# Patient Record
Sex: Female | Born: 1950 | Race: White | Hispanic: No | Marital: Married | Smoking: Never smoker
Health system: Southern US, Community
[De-identification: ages and names within clinical notes are randomized; demographics above are authoritative.]

## PROBLEM LIST (undated history)

## (undated) DIAGNOSIS — C4371 Malignant melanoma of right lower limb, including hip: Secondary | ICD-10-CM

## (undated) DIAGNOSIS — F419 Anxiety disorder, unspecified: Secondary | ICD-10-CM

## (undated) DIAGNOSIS — C801 Malignant (primary) neoplasm, unspecified: Secondary | ICD-10-CM

## (undated) DIAGNOSIS — L409 Psoriasis, unspecified: Secondary | ICD-10-CM

## (undated) HISTORY — DX: Malignant (primary) neoplasm, unspecified: C80.1

## (undated) HISTORY — PX: BREAST CYST EXCISION: SHX579

## (undated) HISTORY — PX: BREAST BIOPSY: SHX20

## (undated) HISTORY — DX: Malignant melanoma of right lower limb, including hip: C43.71

---

## 1983-10-02 HISTORY — PX: CYST REMOVAL NECK: SHX6281

## 1988-10-01 HISTORY — PX: ABDOMINAL HYSTERECTOMY: SHX81

## 1998-11-15 ENCOUNTER — Other Ambulatory Visit: Admission: RE | Admit: 1998-11-15 | Discharge: 1998-11-15 | Payer: Self-pay | Admitting: Obstetrics and Gynecology

## 1999-12-28 ENCOUNTER — Other Ambulatory Visit: Admission: RE | Admit: 1999-12-28 | Discharge: 1999-12-28 | Payer: Self-pay | Admitting: Obstetrics and Gynecology

## 2001-01-03 ENCOUNTER — Other Ambulatory Visit: Admission: RE | Admit: 2001-01-03 | Discharge: 2001-01-03 | Payer: Self-pay | Admitting: Obstetrics and Gynecology

## 2001-08-11 ENCOUNTER — Encounter: Admission: RE | Admit: 2001-08-11 | Discharge: 2001-08-27 | Payer: Self-pay | Admitting: Family Medicine

## 2002-03-17 ENCOUNTER — Other Ambulatory Visit: Admission: RE | Admit: 2002-03-17 | Discharge: 2002-03-17 | Payer: Self-pay | Admitting: Obstetrics and Gynecology

## 2006-08-12 ENCOUNTER — Ambulatory Visit: Payer: Self-pay | Admitting: Family Medicine

## 2009-12-01 ENCOUNTER — Ambulatory Visit: Payer: Self-pay | Admitting: Family Medicine

## 2009-12-01 DIAGNOSIS — N952 Postmenopausal atrophic vaginitis: Secondary | ICD-10-CM | POA: Insufficient documentation

## 2009-12-01 DIAGNOSIS — F411 Generalized anxiety disorder: Secondary | ICD-10-CM | POA: Insufficient documentation

## 2009-12-01 DIAGNOSIS — Z8679 Personal history of other diseases of the circulatory system: Secondary | ICD-10-CM | POA: Insufficient documentation

## 2009-12-01 DIAGNOSIS — L301 Dyshidrosis [pompholyx]: Secondary | ICD-10-CM | POA: Insufficient documentation

## 2009-12-01 LAB — CONVERTED CEMR LAB
Bilirubin Urine: NEGATIVE
Blood in Urine, dipstick: NEGATIVE
Glucose, Urine, Semiquant: NEGATIVE
Nitrite: NEGATIVE
pH: 5

## 2009-12-05 LAB — CONVERTED CEMR LAB
ALT: 25 units/L (ref 0–35)
AST: 24 units/L (ref 0–37)
Albumin: 4.5 g/dL (ref 3.5–5.2)
Alkaline Phosphatase: 66 units/L (ref 39–117)
BUN: 12 mg/dL (ref 6–23)
Basophils Absolute: 0.2 10*3/uL — ABNORMAL HIGH (ref 0.0–0.1)
Calcium: 9.4 mg/dL (ref 8.4–10.5)
Cholesterol: 255 mg/dL — ABNORMAL HIGH (ref 0–200)
Direct LDL: 130.3 mg/dL
Eosinophils Relative: 0.6 % (ref 0.0–5.0)
Hemoglobin: 13.5 g/dL (ref 12.0–15.0)
Monocytes Relative: 6.7 % (ref 3.0–12.0)
Neutrophils Relative %: 61.6 % (ref 43.0–77.0)
Platelets: 277 10*3/uL (ref 150.0–400.0)
RBC: 4.21 M/uL (ref 3.87–5.11)
Sodium: 138 meq/L (ref 135–145)
TSH: 2.38 microintl units/mL (ref 0.35–5.50)
Total Bilirubin: 0.5 mg/dL (ref 0.3–1.2)
Total CHOL/HDL Ratio: 2
Total Protein: 7.7 g/dL (ref 6.0–8.3)
Triglycerides: 63 mg/dL (ref 0.0–149.0)

## 2009-12-26 ENCOUNTER — Ambulatory Visit: Payer: Self-pay | Admitting: Family Medicine

## 2009-12-26 DIAGNOSIS — L409 Psoriasis, unspecified: Secondary | ICD-10-CM | POA: Insufficient documentation

## 2010-01-03 ENCOUNTER — Telehealth: Payer: Self-pay | Admitting: Family Medicine

## 2010-01-04 ENCOUNTER — Ambulatory Visit (HOSPITAL_COMMUNITY): Admission: RE | Admit: 2010-01-04 | Discharge: 2010-01-04 | Payer: Self-pay | Admitting: Family Medicine

## 2010-02-10 ENCOUNTER — Encounter (INDEPENDENT_AMBULATORY_CARE_PROVIDER_SITE_OTHER): Payer: Self-pay | Admitting: *Deleted

## 2010-07-16 ENCOUNTER — Encounter: Payer: Self-pay | Admitting: Family Medicine

## 2010-07-17 ENCOUNTER — Ambulatory Visit: Payer: Self-pay | Admitting: Family Medicine

## 2010-07-17 DIAGNOSIS — L819 Disorder of pigmentation, unspecified: Secondary | ICD-10-CM | POA: Insufficient documentation

## 2010-07-17 DIAGNOSIS — L820 Inflamed seborrheic keratosis: Secondary | ICD-10-CM | POA: Insufficient documentation

## 2010-07-20 ENCOUNTER — Ambulatory Visit: Payer: Self-pay | Admitting: Family Medicine

## 2010-07-20 DIAGNOSIS — C4371 Malignant melanoma of right lower limb, including hip: Secondary | ICD-10-CM | POA: Insufficient documentation

## 2010-07-20 DIAGNOSIS — C439 Malignant melanoma of skin, unspecified: Secondary | ICD-10-CM | POA: Insufficient documentation

## 2010-07-26 ENCOUNTER — Encounter: Payer: Self-pay | Admitting: Family Medicine

## 2010-10-31 NOTE — Assessment & Plan Note (Signed)
Summary: PT RE-EST // RS   Vital Signs:  Patient profile:   60 year old female Menstrual status:  hysterectomy Height:      63 inches Weight:      117 pounds BMI:     20.80 Temp:     98.4 degrees F oral BP sitting:   142 / 82  (left arm) Cuff size:   regular  Vitals Entered By: Kern Reap CMA Duncan Dull) (December 01, 2009 3:56 PM)  Reason for Visit re-establish care     Menstrual Status hysterectomy   History of Present Illness: Kerri Taylor is a 60 year old, married female, nonsmoker G0, P0, who had a hysterectomy........ ovaries were left intact...[name] KitchenMarland KitchenMarland Kitchen for fibroids, who comes in today as a new patient for physical evaluation.  She has a history of palpitations.  In the past.  She took atenolol p.r.n.[name] Kitchen  She would like to get a prescription for that.  She gets routine eye care.  Dental care does BSE monthly, but hasn't had a mammogram in over 3 years.  Wrist review of systems is negative.  Tetanus booster 2002  She also has a history of eczema involving her ear canals and has been helped with some steroid cream.  She has a history of adult acne and would like some Retin-A.  She also has a history of anxiety.  Has been going on for about 3 years.  She denies any depression.  She went through menopause at age 88.  She has trouble with severe vaginal dryness and would like some Premarin cream and   Preventive Screening-Counseling & Management  Alcohol-Tobacco     Smoking Status: quit     Year Quit: 1950  Caffeine-Diet-Exercise     Does Patient Exercise: yes  Hep-HIV-STD-Contraception     Dental Visit-last 6 months yes  Allergies (verified): No Known Drug Allergies  Past History:  Past medical, surgical, family and social histories (including risk factors) reviewed, and no changes noted (except as noted below).  Past Medical History: hysterectomy palpitation varicosties  Past Surgical History: Hysterectomy  Family History: Reviewed history and no changes  required. Father: deceased Mother: aneurysm  Siblings: 4 brothers - healthy               2 sisters - 1 suicide family hx of depression and anxiety  Social History: Reviewed history and no changes required. Occupation:cleans houses Married Alcohol use-yes Regular exercise-yes Does Patient Exercise:  yes Dental Care w/in 6 mos.:  yes Smoking Status:  quit  Review of Systems      See HPI  Physical Exam  General:  Well-developed,well-nourished,in no acute distress; alert,appropriate and cooperative throughout examination Head:  Normocephalic and atraumatic without obvious abnormalities. No apparent alopecia or balding. Eyes:  No corneal or conjunctival inflammation noted. EOMI. Perrla. Funduscopic exam benign, without hemorrhages, exudates or papilledema. Vision grossly normal. Ears:  External ear exam shows no significant lesions or deformities.  Otoscopic examination reveals clear canals, tympanic membranes are intact bilaterally without bulging, retraction, inflammation or discharge. Hearing is grossly normal bilaterally. Nose:  External nasal examination shows no deformity or inflammation. Nasal mucosa are pink and moist without lesions or exudates. Mouth:  Oral mucosa and oropharynx without lesions or exudates.  Teeth in good repair. Neck:  No deformities, masses, or tenderness noted. Chest Wall:  No deformities, masses, or tenderness noted. Breasts:  both breasts appear normal.  On inspection.  There is a scar in the right breast at the 12 o'clock position from previous biopsy.  Both nipples have been chronically inverted.  There is a white cystic lesion under left nipple.  No palpable masses Lungs:  Normal respiratory effort, chest expands symmetrically. Lungs are clear to auscultation, no crackles or wheezes. Heart:  Normal rate and regular rhythm. S1 and S2 normal without gallop, murmur, click, rub or other extra sounds. Abdomen:  Bowel sounds positive,abdomen soft and  non-tender without masses, organomegaly or hernias noted. Rectal:  No external abnormalities noted. Normal sphincter tone. No rectal masses or tenderness. Genitalia:  externa genitalia within normal limits.  Vaginal vault normal except for extremely dry.  Ovaries nonpalpable Msk:  No deformity or scoliosis noted of thoracic or lumbar spine.   Pulses:  R and L carotid,radial,femoral,dorsalis pedis and posterior tibial pulses are full and equal bilaterally Extremities:  No clubbing, cyanosis, edema, or deformity noted with normal full range of motion of all joints.   Neurologic:  No cranial nerve deficits noted. Station and gait are normal. Plantar reflexes are down-going bilaterally. DTRs are symmetrical throughout. Sensory, motor and coordinative functions appear intact. Cervical Nodes:  No lymphadenopathy noted Axillary Nodes:  No palpable lymphadenopathy Inguinal Nodes:  No significant adenopathy Psych:  Oriented X3 and slightly anxious.     Impression & Recommendations:  Problem # 1:  PALPITATIONS, HX OF (ICD-V12.50) Assessment New  Orders: Venipuncture (84696) TLB-Lipid Panel (80061-LIPID) TLB-BMP (Basic Metabolic Panel-BMET) (80048-METABOL) TLB-CBC Platelet - w/Differential (85025-CBCD) TLB-Hepatic/Liver Function Pnl (80076-HEPATIC) TLB-TSH (Thyroid Stimulating Hormone) (84443-TSH) EKG w/ Interpretation (93000)  Problem # 2:  DYSHIDROSIS (ICD-705.81) Assessment: New  Orders: Venipuncture (29528) TLB-Lipid Panel (80061-LIPID) TLB-BMP (Basic Metabolic Panel-BMET) (80048-METABOL) TLB-CBC Platelet - w/Differential (85025-CBCD) TLB-Hepatic/Liver Function Pnl (80076-HEPATIC) TLB-TSH (Thyroid Stimulating Hormone) (84443-TSH)  Problem # 3:  VAGINITIS, ATROPHIC, POSTMENOPAUSAL (ICD-627.3) Assessment: New  Her updated medication list for this problem includes:    Premarin 0.625 Mg/gm Crea (Estrogens, conjugated) .[name] Kitchen... 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 1 tab @ bedtime  Orders: Venipuncture (40102) TLB-Lipid Panel (80061-LIPID) TLB-BMP (Basic Metabolic Panel-BMET) (80048-METABOL) TLB-CBC Platelet - w/Differential (85025-CBCD) TLB-Hepatic/Liver Function Pnl (80076-HEPATIC) TLB-TSH (Thyroid Stimulating Hormone) (84443-TSH) EKG w/ Interpretation (93000)  Problem # 5:  Preventive Health Care (ICD-V70.0) Assessment: New  Complete Medication List: 1)  Atenolol 25 Mg Tabs (Atenolol) .... Take 1 tablet by mouth every morning as needed palpitations 2)  Premarin 0.625 Mg/gm Crea (Estrogens, conjugated) .... Apply 2 x week 3)  Celexa 10 Mg Tabs (Citalopram hydrobromide) .[name] Kitchen.. 1 tab @ bedtime 4)  Triamcinolone Acetonide 0.025 % Crea (Triamcinolone acetonide) .... Uad  Other Orders: Gastroenterology Referral (GI) T-Bone Densitometry [phone]) UA Dipstick w/o Micro (automated)  (81003)  Patient Instructions: 1)  begin Celexa 10 mg at bedtime.  Return in 3 weeks for follow-up. 2)  Premarin vaginal cream twice weekly. 3)  Triamcinolone cream with a Q-tip twice a day for the eczema in your ears. 4)  Okay to use the Retin-A...[name] Kitchen.. remember to apply very small amounts on dry skin......... not wet.......... and wash off in the a.m. 5)  Routine eye exam yearly, regular dental care, BSE monthly, annual mammography, colonoscopy, and BMD Prescriptions: TRIAMCINOLONE ACETONIDE 0.025 % CREA (TRIAMCINOLONE ACETONIDE) UAD  #60 gr x 2   Entered and  Authorized by:   Roderick Pee MD   Signed by:   Roderick Pee MD on 12/01/2009   Method used:   Print then Give  to Patient   RxID:   [id number] CELEXA 10 MG TABS (CITALOPRAM HYDROBROMIDE) 1 tab @ bedtime  #30 x 1   Entered and Authorized by:   Roderick Pee MD   Signed by:   Roderick Pee MD on 12/01/2009   Method used:   Print then Give to Patient   RxID:   [id number] PREMARIN 0.625 MG/GM CREA (ESTROGENS, CONJUGATED) apply 2 x week  #3 tubes x 4   Entered and Authorized by:   Roderick Pee MD   Signed by:   Roderick Pee MD on 12/01/2009   Method used:   Print then Give to Patient   RxID:   [id number] ATENOLOL 25 MG TABS (ATENOLOL) Take 1 tablet by mouth every morning as needed palpitations  #30 x 3   Entered and Authorized by:   Roderick Pee MD   Signed by:   Roderick Pee MD on 12/01/2009   Method used:   Print then Give to Patient   RxID:   [id number]    Immunization History:  Tetanus/Td Immunization History:    Tetanus/Td:  historical (10/01/2000)   Laboratory Results   Urine Tests  Date/Time Recieved: December 01, 2009 5:13 PM  Date/Time Reported: December 01, 2009 5:13 PM   Routine Urinalysis   Color: yellow Appearance: Clear Glucose: negative   (Normal Range: Negative) Bilirubin: negative   (Normal Range: Negative) Ketone: trace (5)   (Normal Range: Negative) Spec. Gravity: 1.015   (Normal Range: 1.003-1.035) Blood: negative   (Normal Range: Negative) pH: 5.0   (Normal Range: 5.0-8.0) Protein: negative   (Normal Range: Negative) Urobilinogen: 0.2   (Normal Range: 0-1) Nitrite: negative   (Normal Range: Negative) Leukocyte Esterace: negative   (Normal Range: Negative)    Comments: Wynona Canes, CMA  December 01, 2009 5:13 PM

## 2010-10-31 NOTE — Miscellaneous (Signed)
Summary: Consent for Procedure  Consent for Procedure   Imported By: Maryln Gottron 07/24/2010 12:22:12  _____________________________________________________________________  External Attachment:    Type:   Image     Comment:   External Document

## 2010-10-31 NOTE — Assessment & Plan Note (Signed)
Summary: 3 wk rov/njr   Vital Signs:  Patient profile:   60 year old female Menstrual status:  hysterectomy BP sitting:   120 / 80  (left arm) Cuff size:   regular  Vitals Entered By: Kern Reap CMA Duncan Dull) (December 26, 2009 11:41 AM) CC: follow-up visit   CC:  follow-up visit.  History of Present Illness: Kerri Taylor is a 60 year old female, who comes in today for evaluation of 3 problems.  A month ago.  We start her on 10 mg of Celexa nightly for anxiety and sleep dysfunction.  She states she feels about 50% better.  No side effects from medication.  We talked about increasing the medication to 20 mg.  Maximum dose 80.  She went to see a dermatologist, who told her she had psoriasis on her scalp and her foot.  She gave her a steroid spray but hasn't done any good.  She also wants Retin-A for wrinkles advise insurance will not pay for this.  Allergies: No Known Drug Allergies  Past History:  Past Medical History: hysterectomy palpitation varicosties psoriasis  Social History: Reviewed history from 12/01/2009 and no changes required. Occupation:cleans houses Married Alcohol use-yes Regular exercise-yes  Review of Systems      See HPI  Physical Exam  General:  Well-developed,well-nourished,in no acute distress; alert,appropriate and cooperative throughout examination Skin:  psoriasis involving the left side of the frontal area of the scalp in her right foot Psych:  Cognition and judgment appear intact. Alert and cooperative with normal attention span and concentration. No apparent delusions, illusions, hallucinations   Impression & Recommendations:  Problem # 1:  ANXIETY DISORDER, GENERALIZED (ICD-300.02) Assessment Improved  The following medications were removed from the medication list:    Celexa 10 Mg Tabs (Citalopram hydrobromide) .[name] Kitchen... 1 tab @ bedtime Her updated medication list for this problem includes:    Celexa 20 Mg Tabs (Citalopram hydrobromide)  .[name] Kitchen... 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 Take 1 tablet by mouth every morning as needed palpitations 2)  Premarin 0.625 Mg/gm Crea (Estrogens, conjugated) .... Apply 2 x week 3)  Triamcinolone Acetonide 0.025 % Crea (Triamcinolone acetonide) .... Uad 4)  Dovonex 0.005 % Soln (Calcipotriene) .... Apply at bedtime 5)  Roc Retinol Correxion Crea (Emollient) .... Apply at bedtime 6)  Celexa 20 Mg Tabs (Citalopram hydrobromide) .[name] Kitchen.. 1 tab @ bedtime  Patient Instructions: 1)  applied the Dovonex scalp solution at bedtime.  Small amounts along with the steroid spray.  If in 4 to 6 weeks.u  don't see any improvement.  Return for reevaluation. 2)  Increase the Celexa to 20 mg nightly if in 4 weeks, you feel well.  Continue that dose.  If not increase it to 30 mg nightly and call [facility] for a new prescription.  Maximum dose is 80 mg daily 3)  Please schedule a follow-up appointment in 1 year. 4)  Please schedule a follow-up appointment as needed. Prescriptions: CELEXA 20 MG TABS (CITALOPRAM HYDROBROMIDE) 1 tab @ bedtime  #100 x 3   Entered and Authorized by:   Roderick Pee MD   Signed by:   Roderick Pee MD on 12/26/2009   Method used:   Electronically to        [facility]* (retail)       [address]       [place]  [address] [place], [place]  [address]       Ph: [phone] or [phone]       Fax: [phone]   RxID:   [id number] ROC RETINOL CORREXION  CREA (EMOLLIENT) apply at bedtime  #PP x 11   Entered and Authorized by:   Roderick Pee MD   Signed by:   Roderick Pee MD on 12/26/2009   Method used:   Print then Give to Patient   RxID:   [id number] DOVONEX 0.005 % SOLN (CALCIPOTRIENE) apply at bedtime  #PP x 11   Entered and Authorized by:   Roderick Pee MD   Signed by:   Roderick Pee MD on 12/26/2009   Method used:    Print then Give to Patient   RxID:   [id number]

## 2010-10-31 NOTE — Consult Note (Signed)
Summary: [facility]   Imported By: Sherian Rein 08/15/2010 13:01:27  _____________________________________________________________________  External Attachment:    Type:   Image     Comment:   External Document

## 2010-10-31 NOTE — Progress Notes (Signed)
Summary: rx not available  Phone Note From Pharmacy   Summary of Call: rx for calcipotriene solution is not available until 02/14/10.  is there something else you would like the patient to try? Initial call taken by: Kern Reap CMA Duncan Dull),  January 03, 2010 2:18 PM  Follow-up for Phone Call        okayed the use of regular Dovonex it's .005% dispense 60 g directions apply nightly refills x 3 Follow-up by: Roderick Pee MD,  January 03, 2010 3:53 PM    New/Updated Medications: DOVONEX 0.005 % CREA (CALCIPOTRIENE) apply nightly to affected area Prescriptions: DOVONEX 0.005 % CREA (CALCIPOTRIENE) apply nightly to affected area  #60g x 3   Entered by:   Kern Reap CMA (AAMA)   Authorized by:   Roderick Pee MD   Signed by:   Kern Reap CMA (AAMA) on 01/05/2010   Method used:   Electronically to        [id number] (retail)       [address]       [place], [place]  [address]       Ph: [phone] or [phone]       Fax: [phone]   RxID:   [id number]

## 2010-10-31 NOTE — Assessment & Plan Note (Signed)
Summary: spots on face//lch   Vital Signs:  Patient profile:   60 year old female Menstrual status:  hysterectomy Weight:      124 pounds Temp:     98.7 degrees F oral BP sitting:   124 / 80  (left arm) Cuff size:   regular  Vitals Entered By: Kern Reap CMA Duncan Dull) (July 17, 2010 10:29 AM) AndCC: spots on face   CC:  spots on face.  History of Present Illness: Kerri Taylor is a 61 year old redheaded female, who comes in today for evaluation of her skin.  She has 3 lesions one left forehead.  Two.  Right upper quadrant and 3, behind her right thigh.  The ground, and changed colors.  After informed consent.  All 3 lesions were removed.  Number one............ 5 mm x 5 mm raised lesion, left forehead.  Number 2.................. 5 mm x 5 mm, black lesion, right upper quadrant number 3..................... 5 mm x 5 mm, black lesion, right posterior thigh.  All 3 lesions were cleaned with alcohol.they were anesthetized with 1% Xylocaine epi..  They were excised with 3-mm margins with a 15 blade.  The area was cauterized Band-Aids were applied, and all 3 lesions were sent for pathologic analysis  Allergies (verified): No Known Drug Allergies   Complete Medication List: 1)  Atenolol 25 Mg Tabs (Atenolol) .... Take 1 tablet by mouth every morning as needed palpitations 2)  Premarin 0.625 Mg/gm Crea (Estrogens, conjugated) .... Apply 2 x week 3)  Triamcinolone Acetonide 0.025 % Crea (Triamcinolone acetonide) .... Uad 4)  Roc Retinol Correxion Crea (Emollient) .... Apply at bedtime 5)  Celexa 20 Mg Tabs (Citalopram hydrobromide) .[name] Kitchen.. 1 tab @ bedtime 6)  Dovonex 0.005 % Crea (Calcipotriene) .... Apply nightly to affected area  Other Orders: Excise Malig lesion (TAL) 0.6 - 1.0 cm (11601) Shave Skin Lesion 0.6-1.0 cm/trunk/arm/leg (11301) Shave Skin Lesion 0.6-1.0cm scalp/neck/hands/feet/genitalia (16109)  Patient Instructions: 1)  make an appointment for your annual exam in  March. 2)  re moved the Band-Aids tonight. 3)  If we do not call u in  report.  Within two weeks.  Call me   Orders Added: 1)  Excise Malig lesion (TAL) 0.6 - 1.0 cm [11601] 2)  Shave Skin Lesion 0.6-1.0 cm/trunk/arm/leg [11301] 3)  Shave Skin Lesion 0.6-1.0cm scalp/neck/hands/feet/genitalia [11306]

## 2010-10-31 NOTE — Assessment & Plan Note (Signed)
Summary: F/U PER PT//SLM   Vital Signs:  Patient profile:   60 year old female Menstrual status:  hysterectomy BP sitting:   130 / 90  (left arm) Cuff size:   regular  Vitals Entered By: Kern Reap CMA Duncan Dull) (July 20, 2010 3:42 PM) CC: follow-up visit   CC:  follow-up visit.  History of Present Illness: Calee is a 60 year old, married female, nonsmoker, who comes in today to review her data from the moles that we removed.  The one on her left forehead was a seborrheic keratosis.  The one on her right abdomen was limp.  He.  The one on the right posterior thigh was a level 2 melanoma.  I explained the significance of this and the importance of surgical treatment and careful follow-up.  I called Dr. Irene Limbo, and he will see her ASAP.  She was advised to do a thorough skin exam monthly at home.  Return to see me in March for annual exam sooner if any problems  Allergies: No Known Drug Allergies  Past History:  Past medical, surgical, family and social histories (including risk factors) reviewed for relevance to current acute and chronic problems.  Past Medical History: Reviewed history from 12/26/2009 and no changes required. hysterectomy palpitation varicosties psoriasis  Past Surgical History: Hysterectomy melanoma right posterior thigh  Family History: Reviewed history from 12/01/2009 and no changes required. Father: deceased Mother: aneurysm  Siblings: 4 brothers - healthy               2 sisters - 1 suicide family hx of depression and anxiety  Social History: Reviewed history from 12/01/2009 and no changes required. Occupation:cleans houses Married Alcohol use-yes Regular exercise-yes  Review of Systems      See HPI  Physical Exam  General:  Well-developed,well-nourished,in no acute distress; alert,appropriate and cooperative throughout examination   Impression & Recommendations:  Problem # 1:  MELANOMA, STAGE II (ICD-172.9) Assessment  New  Complete Medication List: 1)  Atenolol 25 Mg Tabs (Atenolol) .... Take 1 tablet by mouth every morning as needed palpitations 2)  Premarin 0.625 Mg/gm Crea (Estrogens, conjugated) .... Apply 2 x week 3)  Triamcinolone Acetonide 0.025 % Crea (Triamcinolone acetonide) .... Uad 4)  Roc Retinol Correxion Crea (Emollient) .... Apply at bedtime 5)  Celexa 20 Mg Tabs (Citalopram hydrobromide) .[name] Kitchen.. 1 tab @ bedtime 6)  Dovonex 0.005 % Crea (Calcipotriene) .... Apply nightly to affected area  Patient Instructions: 1)  call  the dermatology specialist, Dr. Irene Limbo tomorrow.  We will send a copy of all your medical records to him.  Return in March to see [place] for your annual exam and sooner if any problems.  Do a thorough skin exam monthly   Orders Added: 1)  Est. Patient Level III [16109]

## 2010-10-31 NOTE — Letter (Signed)
Summary: Previsit letter  [facility]  [address] [place], [place] [address]   Phone: [phone]  Fax: [phone]       02/10/2010 MRN: [id number]  Kerri Taylor [address] [place], [place]  [address]  Dear Ms. Lenzen,  Welcome to the Gastroenterology Division at [facility].    You are scheduled to see a nurse for your pre-procedure visit on 03-14-10 at 10:30am on the 3rd floor at [facility], [address]  We ask that you try to arrive at our office 15 minutes prior to your appointment time to allow for check-in.  Your nurse visit will consist of discussing your medical and surgical history, your immediate family medical history, and your medications.    Please bring a complete list of all your medications or, if you prefer, bring the medication bottles and we will list them.  We will need to be aware of both prescribed and over the counter drugs.  We will need to know exact dosage information as well.  If you are on blood thinners (Coumadin, Plavix, Aggrenox, Ticlid, etc.) please call our office today/prior to your appointment, as we need to consult with your physician about holding your medication.   Please be prepared to read and sign documents such as consent forms, a financial agreement, and acknowledgement forms.  If necessary, and with your consent, a friend or relative is welcome to sit-in on the nurse visit with you.  Please bring your insurance card so that we may make a copy of it.  If your insurance requires a referral to see a specialist, please bring your referral form from your primary care physician.  No co-pay is required for this nurse visit.     If you cannot keep your appointment, please call [phone] to cancel or reschedule prior to your appointment date.  This allows [place] the opportunity to schedule an appointment for another patient in need of care.    Thank you for choosing [facility] for your medical  needs.  We appreciate the opportunity to care for you.  Please visit [place] at our website  to learn more about our practice.                     Sincerely.                                                                                                                   The Gastroenterology Division

## 2010-11-10 ENCOUNTER — Other Ambulatory Visit: Payer: Self-pay | Admitting: Dermatology

## 2010-12-11 ENCOUNTER — Other Ambulatory Visit: Payer: Self-pay

## 2010-12-25 ENCOUNTER — Telehealth: Payer: Self-pay | Admitting: Family Medicine

## 2010-12-25 MED ORDER — ESTROGENS, CONJUGATED 0.625 MG/GM VA CREA: TOPICAL_CREAM | Freq: Every day | VAGINAL | Status: AC

## 2010-12-25 MED ORDER — CITALOPRAM HYDROBROMIDE 20 MG PO TABS: 20.0000 mg | ORAL_TABLET | Freq: Every day | ORAL | Status: DC

## 2010-12-25 MED ORDER — TRIAMCINOLONE ACETONIDE 0.025 % EX CREA: TOPICAL_CREAM | Freq: Two times a day (BID) | CUTANEOUS | Status: AC

## 2010-12-25 NOTE — Telephone Encounter (Signed)
Pt needs rx refill of premarin 0.625 mg, anxiety medication (not sure of the name) and also exema cream (not sure of the name).  Please send to [facility].

## 2011-02-13 ENCOUNTER — Other Ambulatory Visit: Payer: Self-pay | Admitting: *Deleted

## 2011-02-13 MED ORDER — CITALOPRAM HYDROBROMIDE 20 MG PO TABS: 20.0000 mg | ORAL_TABLET | Freq: Every day | ORAL | Status: DC

## 2011-10-02 HISTORY — PX: PATELLA FRACTURE SURGERY: SHX735

## 2012-01-25 ENCOUNTER — Encounter (HOSPITAL_COMMUNITY): Payer: Self-pay | Admitting: Emergency Medicine

## 2012-01-25 ENCOUNTER — Emergency Department (HOSPITAL_COMMUNITY): Payer: BC Managed Care – PPO

## 2012-01-25 ENCOUNTER — Emergency Department (HOSPITAL_COMMUNITY)
Admission: EM | Admit: 2012-01-25 | Discharge: 2012-01-25 | Disposition: A | Discharge status: Home / Self Care | Payer: BC Managed Care – PPO | Attending: Emergency Medicine | Admitting: Emergency Medicine

## 2012-01-25 DIAGNOSIS — W010XXA Fall on same level from slipping, tripping and stumbling without subsequent striking against object, initial encounter: Secondary | ICD-10-CM | POA: Insufficient documentation

## 2012-01-25 DIAGNOSIS — S82009A Unspecified fracture of unspecified patella, initial encounter for closed fracture: Secondary | ICD-10-CM | POA: Insufficient documentation

## 2012-01-25 DIAGNOSIS — S838X9A Sprain of other specified parts of unspecified knee, initial encounter: Secondary | ICD-10-CM | POA: Insufficient documentation

## 2012-01-25 DIAGNOSIS — M25569 Pain in unspecified knee: Secondary | ICD-10-CM | POA: Insufficient documentation

## 2012-01-25 DIAGNOSIS — S76119A Strain of unspecified quadriceps muscle, fascia and tendon, initial encounter: Secondary | ICD-10-CM

## 2012-01-25 DIAGNOSIS — M25469 Effusion, unspecified knee: Secondary | ICD-10-CM | POA: Insufficient documentation

## 2012-01-25 HISTORY — DX: Anxiety disorder, unspecified: F41.9

## 2012-01-25 MED ORDER — OXYCODONE-ACETAMINOPHEN 5-325 MG PO TABS
1.0000 | ORAL_TABLET | Freq: Once | ORAL | Status: AC
  Administered 2012-01-25: 1 via ORAL
  Filled 2012-01-25: qty 1

## 2012-01-25 MED ORDER — OXYCODONE-ACETAMINOPHEN 5-325 MG PO TABS: 1.0000 | ORAL_TABLET | Freq: Four times a day (QID) | ORAL | Status: AC | PRN

## 2012-01-25 NOTE — ED Provider Notes (Signed)
History     CSN: [id number]  Arrival date & time 01/25/12  1847   First MD Initiated Contact with Patient 01/25/12 1911      Chief Complaint  Patient presents with  . Fall    HPI The patient presents with right knee pain.  Just prior to presentation the patient slipped and fell her knee twist awkwardly.  Since that time she has been non-ambulatory, and has had persistent sharp pain in the knee.  The pain is nonradiating, worse with motion.  No attempts at analgesia thus far.  She denies any distal dysesthesia, any trauma or pain anywhere else.  She has no orthopedic history in her extremities. Past Medical History  Diagnosis Date  . Anxiety     History reviewed. No pertinent past surgical history.  No family history on file.  History  Substance Use Topics  . Smoking status: Never Smoker   . Smokeless tobacco: Not on file  . Alcohol Use: 1.8 oz/week    3 Cans of beer per week    OB History    Grav Para Term Preterm Abortions TAB SAB Ect Mult Living                  Review of Systems  All other systems reviewed and are negative.    Allergies  Review of patient's allergies indicates no known allergies.  Home Medications   Current Outpatient Rx  Name Route Sig Dispense Refill  . CITALOPRAM HYDROBROMIDE 20 MG PO TABS Oral Take 1 tablet (20 mg total) by mouth daily. 90 tablet 2    BP 127/76  Pulse 72  Temp 98.3 F (36.8 C)  Resp 20  Ht 5\' 4"  (1.626 m)  Wt 120 lb (54.432 kg)  BMI 20.60 kg/m2  SpO2 98%  Physical Exam  Nursing note and vitals reviewed. Constitutional: She is oriented to person, place, and time. She appears well-developed and well-nourished. No distress.  HENT:  Head: Normocephalic and atraumatic.  Eyes: Conjunctivae and EOM are normal.  Cardiovascular: Normal rate and regular rhythm.   Pulmonary/Chest: Effort normal and breath sounds normal. No stridor. No respiratory distress.  Abdominal: She exhibits no distension.  Musculoskeletal:  She exhibits no edema.       Right ankle: Normal.       Legs: Neurological: She is alert and oriented to person, place, and time. No cranial nerve deficit.  Skin: Skin is warm and dry.  Psychiatric: She has a normal mood and affect.    ED Course  Procedures (including critical care time)  Labs Reviewed - No data to display Dg Knee Complete 4 Views Right  01/25/2012  *RADIOLOGY REPORT*  Clinical Data: Status post fall  RIGHT KNEE - COMPLETE 4+ VIEW  Comparison: None  Findings: There is a comminuted fracture deformity involving the patella.  This appears to involve the distal pole with distraction of the fracture fragments by 2.3 cm.  No joint effusion is identified.  There is no dislocation.  IMPRESSION:  1.  There is a distracted and comminuted fracture involving the distal pole of the patella.  Original Report Authenticated By: Rosealee Albee, M.D.   x-ray is interpreted by me   No diagnosis found.    MDM  This previously well female presents with knee pain following a fall.  On exam the patient is uncomfortable, though in no distress.  The patient's x-ray suggests comminuted, distracted fracture consistent with possible quadriceps tendon rupture.  The patient was placed in  an immobilizer and follow up with orthopedics.     Gerhard Munch, MD 01/25/12 2101

## 2012-01-25 NOTE — ED Notes (Signed)
Patient transported to X-ray 

## 2012-01-25 NOTE — ED Notes (Signed)
Pt out for dinner was sitting at a bar stood up to walk slipped on wet spot and landed   on rt knee bought in by EMS c/o pain some swelling

## 2012-04-07 ENCOUNTER — Other Ambulatory Visit: Payer: Self-pay | Admitting: *Deleted

## 2012-04-07 MED ORDER — CITALOPRAM HYDROBROMIDE 20 MG PO TABS: 20.0000 mg | ORAL_TABLET | Freq: Every day | ORAL | Status: DC

## 2012-08-21 ENCOUNTER — Encounter: Payer: Self-pay | Admitting: Family Medicine

## 2012-08-21 ENCOUNTER — Ambulatory Visit (INDEPENDENT_AMBULATORY_CARE_PROVIDER_SITE_OTHER): Payer: BC Managed Care – PPO | Admitting: Family Medicine

## 2012-08-21 VITALS — BP 130/90 | Temp 97.9°F | Wt 126.0 lb

## 2012-08-21 DIAGNOSIS — N952 Postmenopausal atrophic vaginitis: Secondary | ICD-10-CM

## 2012-08-21 DIAGNOSIS — M1990 Unspecified osteoarthritis, unspecified site: Secondary | ICD-10-CM | POA: Insufficient documentation

## 2012-08-21 MED ORDER — ESTRADIOL 0.1 MG/GM VA CREA: 2.0000 g | TOPICAL_CREAM | Freq: Every day | VAGINAL | Status: DC

## 2012-08-21 MED ORDER — TRAMADOL HCL 50 MG PO TABS: ORAL_TABLET | ORAL | Status: DC

## 2012-08-21 MED ORDER — TRIAMCINOLONE ACETONIDE 0.025 % EX OINT: TOPICAL_OINTMENT | Freq: Two times a day (BID) | CUTANEOUS | Status: DC

## 2012-08-21 NOTE — Progress Notes (Signed)
  Subjective:    Patient ID: Kerri Taylor, female    DOB: 07-27-1951, 61 y.o.   MRN: [id number]  HPI Kerri Taylor is a 61 year old single female who comes in today for evaluation of diffuse joint pain  She states the muscles in her neck and back and ribs hurt. He chews he wears the morning. When she gets up and moves around she feels better. She states her is a family history of rheumatoid and osteoarthritis in her family.  No history of trauma. She's been taking 400 mg of Motrin daily for the past 5 days and that has seemed to help her joint pain  She also has a history of melanoma right thigh which we found a couple years ago.  She also has a history of postmenopausal vaginal dryness and is using Premarin cream in the past and would like to have a refill on that.   Review of Systems Gen. review of systems otherwise negative    Objective:   Physical Exam Well-developed and nourished female no acute distress examination of all the joints normal there is some stiffness in the cervical area otherwise full range of motion  Total body skin exam normal       Assessment & Plan:  Osteoarthritis Motrin 600 mg twice a day tramadol each bedtime when necessary  Restart hormonal cream return for physical when do

## 2012-08-21 NOTE — Patient Instructions (Signed)
Use small amounts of the hormone cream 3 times weekly  Motrin 600 mg twice daily with food for your joint and muscle pain  Tramadol 50 mg,,,,,,,,,,,, 1/2-1 tablet at bedtime as needed for severe pain  Return in January for your annual physical exam

## 2012-08-22 ENCOUNTER — Other Ambulatory Visit: Payer: Self-pay | Admitting: *Deleted

## 2012-08-22 MED ORDER — CITALOPRAM HYDROBROMIDE 20 MG PO TABS: 20.0000 mg | ORAL_TABLET | Freq: Every day | ORAL | Status: DC

## 2012-08-25 ENCOUNTER — Ambulatory Visit: Payer: BC Managed Care – PPO | Admitting: Family Medicine

## 2012-08-25 ENCOUNTER — Telehealth: Payer: Self-pay | Admitting: Family Medicine

## 2012-08-25 NOTE — Telephone Encounter (Signed)
Noted, closing encounter.

## 2012-08-25 NOTE — Telephone Encounter (Signed)
Attempted to reach patient 's requested call back.  Reached voicemail leaving request to call back if still needing office.

## 2012-10-20 ENCOUNTER — Encounter: Payer: Self-pay | Admitting: Family Medicine

## 2012-10-20 ENCOUNTER — Ambulatory Visit (INDEPENDENT_AMBULATORY_CARE_PROVIDER_SITE_OTHER): Payer: BC Managed Care – PPO | Admitting: Family Medicine

## 2012-10-20 ENCOUNTER — Other Ambulatory Visit: Payer: Self-pay | Admitting: Family Medicine

## 2012-10-20 VITALS — BP 130/90 | Temp 99.2°F | Ht 64.0 in | Wt 127.0 lb

## 2012-10-20 DIAGNOSIS — L408 Other psoriasis: Secondary | ICD-10-CM

## 2012-10-20 DIAGNOSIS — N952 Postmenopausal atrophic vaginitis: Secondary | ICD-10-CM

## 2012-10-20 DIAGNOSIS — Z1231 Encounter for screening mammogram for malignant neoplasm of breast: Secondary | ICD-10-CM

## 2012-10-20 DIAGNOSIS — M1990 Unspecified osteoarthritis, unspecified site: Secondary | ICD-10-CM

## 2012-10-20 DIAGNOSIS — C439 Malignant melanoma of skin, unspecified: Secondary | ICD-10-CM

## 2012-10-20 DIAGNOSIS — Z23 Encounter for immunization: Secondary | ICD-10-CM

## 2012-10-20 DIAGNOSIS — Z0000 Encounter for general adult medical examination without abnormal findings: Secondary | ICD-10-CM

## 2012-10-20 MED ORDER — TRIAMCINOLONE ACETONIDE 0.025 % EX OINT: TOPICAL_OINTMENT | Freq: Two times a day (BID) | CUTANEOUS | Status: DC

## 2012-10-20 MED ORDER — ESTRADIOL 0.1 MG/GM VA CREA: 2.0000 g | TOPICAL_CREAM | Freq: Every day | VAGINAL | Status: DC

## 2012-10-20 MED ORDER — CITALOPRAM HYDROBROMIDE 20 MG PO TABS: ORAL_TABLET | ORAL | Status: DC

## 2012-10-20 MED ORDER — ESTROGENS, CONJUGATED 0.625 MG/GM VA CREA: TOPICAL_CREAM | Freq: Every day | VAGINAL | Status: DC

## 2012-10-20 NOTE — Progress Notes (Signed)
  Subjective:    Patient ID: Kerri Taylor, female    DOB: [birth year], 62 y.o.   MRN: [id number]  HPI Kerri Taylor is a 62 year old married female nonsmoker who comes in today for general physical examination  She takes 20 mg of Celexa daily in the morning. Recommend she take it at bedtime  She's his hormonal cream once weekly for vaginal dryness  She tried taking the tramadol one half to one tablet at bedtime for osteoarthritis but it didn't help and she had side effects of nausea even on half a tablet a day  She switch to Motrin 400 mg daily in the morning  She also has eczema and uses triamcinolone cream  She's had a history of melanoma and we will do a thorough skin exam  She gets routine eye care, dental care, BSE monthly, and you mammography, she was set up for a colonoscopy but had to go out of town, tetanus booster today   Review of Systems  Constitutional: Negative.   HENT: Negative.   Eyes: Negative.   Respiratory: Negative.   Cardiovascular: Negative.   Gastrointestinal: Negative.   Genitourinary: Negative.   Musculoskeletal: Negative.   Neurological: Negative.   Hematological: Negative.   Psychiatric/Behavioral: Negative.        Objective:   Physical Exam  Constitutional: She appears well-developed and well-nourished.  HENT:  Head: Normocephalic and atraumatic.  Right Ear: External ear normal.  Left Ear: External ear normal.  Nose: Nose normal.  Mouth/Throat: Oropharynx is clear and moist.  Eyes: EOM are normal. Pupils are equal, round, and reactive to light.  Neck: Normal range of motion. Neck supple. No thyromegaly present.  Cardiovascular: Normal rate, regular rhythm, normal heart sounds and intact distal pulses.  Exam reveals no gallop and no friction rub.   No murmur heard. Pulmonary/Chest: Effort normal and breath sounds normal.  Abdominal: Soft. Bowel sounds are normal. She exhibits no distension and no mass. There is no tenderness. There is no rebound.    Genitourinary: Vagina normal. Guaiac negative stool. No vaginal discharge found.       Bilateral breast exam left breast was normal however the nipple was inverted but this is a chronic issue right breast also nipple inverted. Scar right breast 12:00 position from previous biopsy physical exam normal encouraged to continue BSE monthly and get annual mammography  Musculoskeletal: Normal range of motion.  Lymphadenopathy:    She has no cervical adenopathy.  Neurological: She is alert. She has normal reflexes. No cranial nerve deficit. She exhibits normal muscle tone. Coordination normal.  Skin: Skin is warm and dry.       Total body skin exam normal. Scars from previous lesions removed including the melanoma. No evidence of recurrence she does have a circular lesion on the mid scalp consistent with a patch of psoriasis  Psychiatric: She has a normal Taylor and affect. Her behavior is normal. Judgment and thought content normal.          Assessment & Plan:  Healthy female  History of mild depression increase Celexa to 30 mg daily  Postmenopausal vaginal dryness use the cream twice weekly  Osteoarthritis Motrin 600 twice a day with food  Eczema triamcinolone when necessary  History of melanoma followup study in exam in 6 months

## 2012-10-20 NOTE — Patient Instructions (Addendum)
Increased the Celexa take a tablet in half at bedtime  Use small amounts of the hormonal cream twice weekly  Motrin 600 mg twice daily with food for joint pain  Triamcinolone when necessary for eczema  Return in 6 months for a repeat skin exam  Remember to do a thorough breast exam monthly and get her mammogram  We will put you back in the 2 for a colonoscopy

## 2012-10-22 ENCOUNTER — Encounter: Payer: Self-pay | Admitting: Gastroenterology

## 2012-10-27 ENCOUNTER — Ambulatory Visit (HOSPITAL_COMMUNITY)
Admission: RE | Admit: 2012-10-27 | Discharge: 2012-10-27 | Disposition: A | Discharge status: Home / Self Care | Payer: BC Managed Care – PPO | Source: Ambulatory Visit | Attending: Family Medicine | Admitting: Family Medicine

## 2012-10-27 DIAGNOSIS — Z1231 Encounter for screening mammogram for malignant neoplasm of breast: Secondary | ICD-10-CM

## 2012-11-20 ENCOUNTER — Ambulatory Visit (AMBULATORY_SURGERY_CENTER): Payer: BC Managed Care – PPO | Admitting: *Deleted

## 2012-11-20 ENCOUNTER — Encounter: Payer: Self-pay | Admitting: Gastroenterology

## 2012-11-20 VITALS — Ht 64.0 in | Wt 128.6 lb

## 2012-11-20 DIAGNOSIS — Z1211 Encounter for screening for malignant neoplasm of colon: Secondary | ICD-10-CM

## 2012-11-20 MED ORDER — NA SULFATE-K SULFATE-MG SULF 17.5-3.13-1.6 GM/177ML PO SOLN: ORAL | Status: DC

## 2012-11-28 ENCOUNTER — Ambulatory Visit (AMBULATORY_SURGERY_CENTER): Payer: BC Managed Care – PPO | Admitting: Gastroenterology

## 2012-11-28 ENCOUNTER — Encounter: Payer: Self-pay | Admitting: Gastroenterology

## 2012-11-28 VITALS — BP 111/69 | HR 67 | Temp 97.6°F | Resp 12 | Ht 64.0 in | Wt 128.0 lb

## 2012-11-28 DIAGNOSIS — D126 Benign neoplasm of colon, unspecified: Secondary | ICD-10-CM

## 2012-11-28 DIAGNOSIS — Z1211 Encounter for screening for malignant neoplasm of colon: Secondary | ICD-10-CM

## 2012-11-28 MED ORDER — SODIUM CHLORIDE 0.9 % IV SOLN: 500.0000 mL | INTRAVENOUS | Status: DC

## 2012-11-28 NOTE — Progress Notes (Signed)
Patient did not experience any of the following events: a burn prior to discharge; a fall within the facility; wrong site/side/patient/procedure/implant event; or a hospital transfer or hospital admission upon discharge from the facility. (G8907) Patient did not have preoperative order for IV antibiotic SSI prophylaxis. (G8918)  

## 2012-11-28 NOTE — Progress Notes (Signed)
Report to pacu rn vss, bbs=clear 

## 2012-11-28 NOTE — Patient Instructions (Addendum)

## 2012-11-28 NOTE — Op Note (Signed)
[facility] [address] [place], [address]   COLONOSCOPY PROCEDURE REPORT PATIENT: Kerri Taylor, Kerri Taylor.  MR#: [id number] BIRTHDATE: June 10, 1951 , 62  yrs. old GENDER: Female ENDOSCOPIST: Meryl Dare, MD, [facility] REFERRED UY:[id number] Shawnie Dapper, M.D. PROCEDURE DATE:  11/28/2012 PROCEDURE:   Colonoscopy with snare polypectomy ASA CLASS:   Class II INDICATIONS:average risk screening. MEDICATIONS: MAC sedation, administered by CRNA and propofol (Diprivan) 300mg  IV DESCRIPTION OF PROCEDURE:   After the risks benefits and alternatives of the procedure were thoroughly explained, informed consent was obtained.  A digital rectal exam revealed no abnormalities of the rectum.   The LB [id number]  endoscope was introduced through the anus and advanced to the cecum, which was identified by both the appendix and ileocecal valve. No adverse events experienced.   The quality of the prep was good, using MoviPrep  The instrument was then slowly withdrawn as the colon was fully examined.  COLON FINDINGS: A sessile polyp measuring 5 mm in size was found at the cecum.  A polypectomy was performed with a cold snare.  The resection was complete and the polyp tissue was completely retrieved.   A sessile polyp measuring 8 mm in size was found in the descending colon.  A polypectomy was performed with a cold snare.  The resection was complete and the polyp tissue was completely retrieved.   A sessile polyp measuring 7 mm in size was found in the sigmoid colon.  A polypectomy was performed with a cold snare.  The resection was complete and the polyp tissue was completely retrieved.   Mild diverticulosis was noted in the sigmoid colon.   The colon was otherwise normal.  There was no diverticulosis, inflammation, polyps or cancers unless previously stated.  Retroflexed views revealed no abnormalities. The time to cecum=1 minutes 25 seconds.  Withdrawal time=11 minutes 26 seconds. The  scope was withdrawn and the procedure completed. COMPLICATIONS: There were no complications.  ENDOSCOPIC IMPRESSION: 1.   Sessile polyp measuring 5 mm at the cecum; polypectomy performed with a cold snare 2.   Sessile polyp measuring 8 mm in the descending colon; polypectomy performed with a cold snare 3.   Sessile polyp measuring 7 mm in the sigmoid colon; polypectomy performed with a cold snare 4.   Mild diverticulosis was noted in the sigmoid colon  RECOMMENDATIONS: 1.  Await pathology results 2.  Repeat colonoscopy in 3 years if 3 polys adenomatous; 5 years if 1-2 polyps adenomatous; otherwise 10 years 3.  High fiber diet with liberal fluid intake.  eSigned:  Meryl Dare, MD, [facility] 11/28/2012 1:45 PM

## 2012-12-01 ENCOUNTER — Telehealth: Payer: Self-pay | Admitting: *Deleted

## 2012-12-01 NOTE — Telephone Encounter (Signed)
  Follow up Call-  Call back number 11/28/2012  Post procedure Call Back phone  # [phone]  Permission to leave phone message Yes     Patient questions:  Do you have a fever, pain , or abdominal swelling? no Pain Score  0 *  Have you tolerated food without any problems? yes  Have you been able to return to your normal activities? yes  Do you have any questions about your discharge instructions: Diet   no Medications  no Follow up visit  no  Do you have questions or concerns about your Care? no  Actions: * If pain score is 4 or above: No action needed, pain <4.

## 2012-12-03 ENCOUNTER — Encounter: Payer: Self-pay | Admitting: Gastroenterology

## 2013-03-30 ENCOUNTER — Ambulatory Visit: Payer: BC Managed Care – PPO | Admitting: Family Medicine

## 2013-04-13 ENCOUNTER — Encounter: Payer: Self-pay | Admitting: Family Medicine

## 2013-04-13 ENCOUNTER — Ambulatory Visit (INDEPENDENT_AMBULATORY_CARE_PROVIDER_SITE_OTHER): Payer: 59 | Admitting: Family Medicine

## 2013-04-13 VITALS — BP 110/80 | Temp 98.3°F | Wt 128.0 lb

## 2013-04-13 DIAGNOSIS — C439 Malignant melanoma of skin, unspecified: Secondary | ICD-10-CM

## 2013-04-13 DIAGNOSIS — C4371 Malignant melanoma of right lower limb, including hip: Secondary | ICD-10-CM | POA: Insufficient documentation

## 2013-04-13 NOTE — Patient Instructions (Addendum)
Return in September to freeze the 3 lesions,,,,,,,,,,,, tell them we need a 30 minute appointment Continue your sunscreens SPF 50+ and sun protection with a hat

## 2013-04-13 NOTE — Progress Notes (Signed)
  Subjective:    Patient ID: Kerri Taylor, female    DOB: 11/29/1950, 62 y.o.   MRN: [id number]  HPI Kerri Taylor is a delightful 62 year old female nonsmoker who comes in for a total body skin exam because of a history of a melanoma right thigh  She had a melanoma right thigh she had extensive surgery and they felt like it was completely removed.  She does have a lot of sun exposure  She is very compliant now about using sunscreens.  She has 3 lesions on her scalp that are seborrheic keratosis that she would like frozen   Review of Systems Review of systems otherwise negative    Objective:   Physical Exam  Well-developed and nourished female no acute distress total body skin exam normal except for scars from previous lesions were removed. The margins of appears normal no re\re pigmentation. There is a 3 inch scar on her right lateral thigh from previous melanoma removal.      Assessment & Plan:  History of melanoma normal skin exam today continue sunscreens followup in 6 months

## 2013-06-15 ENCOUNTER — Ambulatory Visit: Payer: 59 | Admitting: Family Medicine

## 2013-10-19 ENCOUNTER — Other Ambulatory Visit (INDEPENDENT_AMBULATORY_CARE_PROVIDER_SITE_OTHER): Payer: 59

## 2013-10-19 DIAGNOSIS — Z0000 Encounter for general adult medical examination without abnormal findings: Secondary | ICD-10-CM

## 2013-10-19 LAB — CBC WITH DIFFERENTIAL/PLATELET
BASOS PCT: 0.6 % (ref 0.0–3.0)
Basophils Absolute: 0 10*3/uL (ref 0.0–0.1)
Eosinophils Absolute: 0.1 10*3/uL (ref 0.0–0.7)
Eosinophils Relative: 2.7 % (ref 0.0–5.0)
HCT: 41.2 % (ref 36.0–46.0)
HEMOGLOBIN: 14.1 g/dL (ref 12.0–15.0)
LYMPHS PCT: 41.3 % (ref 12.0–46.0)
Lymphs Abs: 1.2 10*3/uL (ref 0.7–4.0)
MCHC: 34.1 g/dL (ref 30.0–36.0)
MCV: 92.6 fl (ref 78.0–100.0)
MONO ABS: 0.3 10*3/uL (ref 0.1–1.0)
MONOS PCT: 9.6 % (ref 3.0–12.0)
NEUTROS ABS: 1.4 10*3/uL (ref 1.4–7.7)
NEUTROS PCT: 45.8 % (ref 43.0–77.0)
Platelets: 276 10*3/uL (ref 150.0–400.0)
RBC: 4.46 Mil/uL (ref 3.87–5.11)
RDW: 14.2 % (ref 11.5–14.6)
WBC: 3 10*3/uL — ABNORMAL LOW (ref 4.5–10.5)

## 2013-10-19 LAB — POCT URINALYSIS DIPSTICK
Bilirubin, UA: NEGATIVE
Blood, UA: NEGATIVE
Glucose, UA: NEGATIVE
KETONES UA: NEGATIVE
Leukocytes, UA: NEGATIVE
Nitrite, UA: NEGATIVE
PH UA: 5
PROTEIN UA: NEGATIVE
Spec Grav, UA: 1.01
Urobilinogen, UA: 0.2

## 2013-10-19 LAB — BASIC METABOLIC PANEL
BUN: 22 mg/dL (ref 6–23)
CALCIUM: 9 mg/dL (ref 8.4–10.5)
CO2: 27 mEq/L (ref 19–32)
Chloride: 100 mEq/L (ref 96–112)
Creatinine, Ser: 0.8 mg/dL (ref 0.4–1.2)
GFR: 76.52 mL/min (ref >60.00)
GLUCOSE: 91 mg/dL (ref 70–99)
Potassium: 3.9 mEq/L (ref 3.5–5.1)
SODIUM: 137 meq/L (ref 135–145)

## 2013-10-19 LAB — TSH: TSH: 2.8 u[IU]/mL (ref 0.35–5.50)

## 2013-10-19 LAB — LDL CHOLESTEROL, DIRECT: LDL DIRECT: 199.7 mg/dL

## 2013-10-19 LAB — LIPID PANEL
Cholesterol: 295 mg/dL — ABNORMAL HIGH (ref 0–200)
HDL: 85.1 mg/dL (ref >39.00)
Total CHOL/HDL Ratio: 3
Triglycerides: 108 mg/dL (ref 0.0–149.0)
VLDL: 21.6 mg/dL (ref 0.0–40.0)

## 2013-10-19 LAB — HEPATIC FUNCTION PANEL
ALK PHOS: 64 U/L (ref 39–117)
ALT: 19 U/L (ref 0–35)
AST: 19 U/L (ref 0–37)
Albumin: 4.1 g/dL (ref 3.5–5.2)
Bilirubin, Direct: 0 mg/dL (ref 0.0–0.3)
TOTAL PROTEIN: 7.3 g/dL (ref 6.0–8.3)
Total Bilirubin: 0.7 mg/dL (ref 0.3–1.2)

## 2013-10-26 ENCOUNTER — Encounter: Payer: Self-pay | Admitting: Family Medicine

## 2013-10-26 ENCOUNTER — Ambulatory Visit (INDEPENDENT_AMBULATORY_CARE_PROVIDER_SITE_OTHER): Payer: 59 | Admitting: Family Medicine

## 2013-10-26 VITALS — BP 124/80 | Temp 98.3°F | Ht 64.5 in | Wt 128.0 lb

## 2013-10-26 DIAGNOSIS — L820 Inflamed seborrheic keratosis: Secondary | ICD-10-CM

## 2013-10-26 DIAGNOSIS — M1990 Unspecified osteoarthritis, unspecified site: Secondary | ICD-10-CM

## 2013-10-26 DIAGNOSIS — F411 Generalized anxiety disorder: Secondary | ICD-10-CM

## 2013-10-26 DIAGNOSIS — N952 Postmenopausal atrophic vaginitis: Secondary | ICD-10-CM

## 2013-10-26 DIAGNOSIS — L408 Other psoriasis: Secondary | ICD-10-CM

## 2013-10-26 DIAGNOSIS — L301 Dyshidrosis [pompholyx]: Secondary | ICD-10-CM

## 2013-10-26 MED ORDER — ESTROGENS, CONJUGATED 0.625 MG/GM VA CREA: TOPICAL_CREAM | Freq: Every day | VAGINAL | Status: DC

## 2013-10-26 MED ORDER — TRIAMCINOLONE ACETONIDE 0.025 % EX OINT: TOPICAL_OINTMENT | Freq: Two times a day (BID) | CUTANEOUS | Status: DC

## 2013-10-26 MED ORDER — CITALOPRAM HYDROBROMIDE 20 MG PO TABS: ORAL_TABLET | ORAL | Status: DC

## 2013-10-26 NOTE — Progress Notes (Signed)
Pre visit review using our clinic review tool, if applicable. No additional management support is needed unless otherwise documented below in the visit note. 

## 2013-10-26 NOTE — Patient Instructions (Signed)
Continue current medications  Return sometime this winter for removal of the 3 lesions on her face that we discussed  Return in one year for general physical examination sooner if any problems  Return in 6 months for a thorough skin exam because of your history of melanoma

## 2013-10-26 NOTE — Progress Notes (Signed)
   Subjective:    Patient ID: Kerri Taylor, female    DOB: Apr 10, 1951, 63 y.o.   MRN: [id number]  HPI Shaniece is a 63 year old female nonsmoker who comes in today for general physical examination because of a history of a melanoma in her right eye, mild anxiety, postmenopausal vaginal dryness, and eczema of her feet.  She gets routine eye care, dental care, BSE monthly, and you mammography, colonoscopy 2013 showed 3 small polyps she is due to go back for followup. Her brother also had colon polyps. She's got a couple lumps on the base of each foot she would like checked. She also has 3 lesions one midline of her scalp and 2 on the left side of her scalp that a seborrheic keratosis but they're irritated and itching and growing.  Gen. melanoma right lateral thigh years ago had surgical removal asymptomatic since that time.  She takes Celexa 20 mg....... one and half tablets at bedtime, Premarin vaginal cream twice weekly and triamcinolone twice a day for the eczema of her feet.  She had her uterus removed for nonmalignant reasons ovaries were left intact   Review of Systems  Constitutional: Negative.   HENT: Negative.   Eyes: Negative.   Respiratory: Negative.   Cardiovascular: Negative.   Gastrointestinal: Negative.   Endocrine: Negative.   Genitourinary: Negative.   Musculoskeletal: Negative.   Allergic/Immunologic: Negative.   Neurological: Negative.   Hematological: Negative.   Psychiatric/Behavioral: Negative.        Objective:   Physical Exam  Nursing note and vitals reviewed. Constitutional: She is oriented to person, place, and time. She appears well-developed and well-nourished.  HENT:  Head: Normocephalic and atraumatic.  Right Ear: External ear normal.  Left Ear: External ear normal.  Nose: Nose normal.  Mouth/Throat: Oropharynx is clear and moist.  Eyes: EOM are normal. Pupils are equal, round, and reactive to light.  Neck: Normal range of motion. Neck supple. No  thyromegaly present.  Cardiovascular: Normal rate, regular rhythm, normal heart sounds and intact distal pulses.  Exam reveals no gallop and no friction rub.   No murmur heard. Pulmonary/Chest: Effort normal and breath sounds normal.  Abdominal: Soft. Bowel sounds are normal. She exhibits no distension and no mass. There is no tenderness. There is no rebound.  Genitourinary: Vagina normal. Guaiac negative stool. No vaginal discharge found.  Bilateral breast exam shows chronically bilateral inverted nipples. There is a small cyst on her left nipple. No palpable masses recent mammogram normal  Musculoskeletal: Normal range of motion.  Lymphadenopathy:    She has no cervical adenopathy.  Neurological: She is alert and oriented to person, place, and time. She has normal reflexes. No cranial nerve deficit. She exhibits normal muscle tone. Coordination normal.  Skin: Skin is warm and dry.  Total body skin exam shows a scar right lateral 5 from previous melanoma removal. Total skin exam other meds negative except for 3 lesions on her face. The seborrheic keratosis but irritated and growing advise return for removal  Psychiatric: She has a normal mood and affect. Her behavior is normal. Judgment and thought content normal.          Assessment & Plan:  Healthy female  History of mild anxiety/depression continue Celexa 30 mg daily  Postmenopausal vaginal dryness continue Premarin vaginal cream twice weekly  Eczema of her feet continue the Kenalog gel along with moisturizing cream  Status post melanoma right lateral thigh  3 lesions on her face advise return for removal

## 2013-11-17 ENCOUNTER — Ambulatory Visit: Payer: 59 | Admitting: Family Medicine

## 2014-04-26 ENCOUNTER — Ambulatory Visit: Payer: 59 | Admitting: Family Medicine

## 2014-06-21 ENCOUNTER — Ambulatory Visit: Payer: 59 | Admitting: Family Medicine

## 2014-11-11 ENCOUNTER — Ambulatory Visit (INDEPENDENT_AMBULATORY_CARE_PROVIDER_SITE_OTHER): Payer: Medicare Other | Admitting: Family Medicine

## 2014-11-11 ENCOUNTER — Other Ambulatory Visit: Payer: Self-pay | Admitting: Family Medicine

## 2014-11-11 DIAGNOSIS — L989 Disorder of the skin and subcutaneous tissue, unspecified: Secondary | ICD-10-CM

## 2014-11-11 DIAGNOSIS — L409 Psoriasis, unspecified: Secondary | ICD-10-CM

## 2014-11-11 DIAGNOSIS — B079 Viral wart, unspecified: Secondary | ICD-10-CM | POA: Diagnosis not present

## 2014-11-11 DIAGNOSIS — L820 Inflamed seborrheic keratosis: Secondary | ICD-10-CM

## 2014-11-11 MED ORDER — TRIAMCINOLONE ACETONIDE 0.025 % EX OINT: TOPICAL_OINTMENT | Freq: Two times a day (BID) | CUTANEOUS | Status: DC

## 2014-11-11 NOTE — Patient Instructions (Signed)
Within 2 weeks we will call you the report 

## 2014-11-11 NOTE — Progress Notes (Signed)
   Subjective:    Patient ID: Kerri Taylor, female    DOB: 04-16-1951, 64 y.o.   MRN: [id number]  HPI Kerri Taylor is a 64 year old female who comes in today for move of 2 lesions  She's had a history of melanoma on her thigh and is very sensitive to anything that's changed  She got a brown spot milligrams forehead and hairline also she has a lesion in her left ear canal.  Lesion #1 is a 6 mm x 6 mm brown lesion mid forehead  Lesion #2 is 6 mm x 6 mm pedunculated lesion in the left ear canal.  After informed consent both lesions were cleaned with alcohol and anesthetized with 1% Xylocaine with epinephrine. They were excised with 2 mm margins in the bases were cauterized. No bleeding therefore Band-Aids were not applied   Review of Systems    history of melanoma  history of melanoma Objective:   Physical Exam   Well-developed well-nourished female no acute distress 2 lesions removed by shave excision as noted above     Assessment & Plan:Lesion #1 mid forehead, lesion #1 mid forehead ,,,,,, clinically seborrheic keratosis irritated lesion #2 left ear canal ,,,,,,,,, probable irritated seborrheic keratosis

## 2015-01-10 ENCOUNTER — Other Ambulatory Visit (INDEPENDENT_AMBULATORY_CARE_PROVIDER_SITE_OTHER): Payer: Medicare Other

## 2015-01-10 DIAGNOSIS — Z0000 Encounter for general adult medical examination without abnormal findings: Secondary | ICD-10-CM

## 2015-01-10 LAB — CBC WITH DIFFERENTIAL/PLATELET
BASOS PCT: 0.5 % (ref 0.0–3.0)
Basophils Absolute: 0 10*3/uL (ref 0.0–0.1)
Eosinophils Absolute: 0.1 10*3/uL (ref 0.0–0.7)
Eosinophils Relative: 2.4 % (ref 0.0–5.0)
HCT: 42.9 % (ref 36.0–46.0)
Hemoglobin: 14.9 g/dL (ref 12.0–15.0)
Lymphocytes Relative: 38 % (ref 12.0–46.0)
Lymphs Abs: 1.3 10*3/uL (ref 0.7–4.0)
MCHC: 34.6 g/dL (ref 30.0–36.0)
MCV: 91.1 fl (ref 78.0–100.0)
Monocytes Absolute: 0.3 10*3/uL (ref 0.1–1.0)
Monocytes Relative: 9.5 % (ref 3.0–12.0)
Neutro Abs: 1.8 10*3/uL (ref 1.4–7.7)
Neutrophils Relative %: 49.6 % (ref 43.0–77.0)
PLATELETS: 269 10*3/uL (ref 150.0–400.0)
RBC: 4.72 Mil/uL (ref 3.87–5.11)
RDW: 13.6 % (ref 11.5–15.5)
WBC: 3.5 10*3/uL — ABNORMAL LOW (ref 4.0–10.5)

## 2015-01-10 LAB — HEPATIC FUNCTION PANEL
ALBUMIN: 4.2 g/dL (ref 3.5–5.2)
ALK PHOS: 78 U/L (ref 39–117)
ALT: 16 U/L (ref 0–35)
AST: 18 U/L (ref 0–37)
BILIRUBIN DIRECT: 0.1 mg/dL (ref 0.0–0.3)
TOTAL PROTEIN: 7.2 g/dL (ref 6.0–8.3)
Total Bilirubin: 0.5 mg/dL (ref 0.2–1.2)

## 2015-01-10 LAB — BASIC METABOLIC PANEL
BUN: 14 mg/dL (ref 6–23)
CALCIUM: 9.4 mg/dL (ref 8.4–10.5)
CHLORIDE: 102 meq/L (ref 96–112)
CO2: 30 meq/L (ref 19–32)
CREATININE: 0.65 mg/dL (ref 0.40–1.20)
GFR: 96.87 mL/min (ref >60.00)
Glucose, Bld: 98 mg/dL (ref 70–99)
Potassium: 3.9 mEq/L (ref 3.5–5.1)
Sodium: 138 mEq/L (ref 135–145)

## 2015-01-10 LAB — LIPID PANEL
CHOL/HDL RATIO: 4
Cholesterol: 286 mg/dL — ABNORMAL HIGH (ref 0–200)
HDL: 76.2 mg/dL (ref >39.00)
LDL Cholesterol: 191 mg/dL — ABNORMAL HIGH (ref 0–99)
NONHDL: 209.8
Triglycerides: 93 mg/dL (ref 0.0–149.0)
VLDL: 18.6 mg/dL (ref 0.0–40.0)

## 2015-01-10 LAB — POCT URINALYSIS DIPSTICK
Bilirubin, UA: NEGATIVE
Blood, UA: NEGATIVE
GLUCOSE UA: NEGATIVE
Ketones, UA: NEGATIVE
LEUKOCYTES UA: NEGATIVE
NITRITE UA: NEGATIVE
Protein, UA: NEGATIVE
Spec Grav, UA: 1.015
UROBILINOGEN UA: 0.2
pH, UA: 6

## 2015-01-10 LAB — TSH: TSH: 3.13 u[IU]/mL (ref 0.35–4.50)

## 2015-01-17 ENCOUNTER — Ambulatory Visit (INDEPENDENT_AMBULATORY_CARE_PROVIDER_SITE_OTHER): Payer: Medicare Other | Admitting: Family Medicine

## 2015-01-17 VITALS — BP 120/84 | Temp 98.1°F | Ht 64.5 in | Wt 124.0 lb

## 2015-01-17 DIAGNOSIS — C4371 Malignant melanoma of right lower limb, including hip: Secondary | ICD-10-CM

## 2015-01-17 DIAGNOSIS — F411 Generalized anxiety disorder: Secondary | ICD-10-CM | POA: Diagnosis not present

## 2015-01-17 DIAGNOSIS — E7800 Pure hypercholesterolemia, unspecified: Secondary | ICD-10-CM

## 2015-01-17 DIAGNOSIS — Z23 Encounter for immunization: Secondary | ICD-10-CM | POA: Diagnosis not present

## 2015-01-17 DIAGNOSIS — N952 Postmenopausal atrophic vaginitis: Secondary | ICD-10-CM | POA: Diagnosis not present

## 2015-01-17 DIAGNOSIS — Z780 Asymptomatic menopausal state: Secondary | ICD-10-CM | POA: Diagnosis not present

## 2015-01-17 DIAGNOSIS — L409 Psoriasis, unspecified: Secondary | ICD-10-CM

## 2015-01-17 MED ORDER — ESTROGENS, CONJUGATED 0.625 MG/GM VA CREA: TOPICAL_CREAM | Freq: Every day | VAGINAL | Status: DC

## 2015-01-17 MED ORDER — CITALOPRAM HYDROBROMIDE 20 MG PO TABS: ORAL_TABLET | ORAL | Status: DC

## 2015-01-17 NOTE — Progress Notes (Signed)
Pre visit review using our clinic review tool, if applicable. No additional management support is needed unless otherwise documented below in the visit note. 

## 2015-01-17 NOTE — Progress Notes (Signed)
   Subjective:    Patient ID: Kerri Taylor, female    DOB: 12-Mar-1951, 64 y.o.   MRN: [id number]  HPI Kerri Taylor is a 64 year old female nonsmoker who comes in today for general physical examination  She's had a history of a melanoma on her right lateral thigh. We do yearly skin exam plus she checks her skin frequently at home.  She takes Celexa 20 mg daily at bedtime because of a history of mild anxiety  He uses Premarin cream small amounts 2-3 times weekly for vaginal dryness. Pelvic and Pap last year were normal. No history of any problems with her Paps in the past recommend every 3 years  She uses Kenalog when necessary for some psoriasis of her scalp  She gets routine eye care, dental care, BSE monthly, last mammogram 2 years ago. Recommend annual mammography. Colonoscopy 2014 normal except for 2 small polyps. GI-wise she is asymptomatic  Vaccinations updated by Apolonio Schneiders  Cognitive function normal she walks daily home health safety reviewed no issues identified, no guns in the house, she does have a healthcare power of attorney and living well   Review of Systems  Constitutional: Negative.   HENT: Negative.   Eyes: Negative.   Respiratory: Negative.   Cardiovascular: Negative.   Gastrointestinal: Negative.   Endocrine: Negative.   Genitourinary: Negative.   Musculoskeletal: Negative.   Skin: Negative.   Allergic/Immunologic: Negative.   Neurological: Negative.   Hematological: Negative.   Psychiatric/Behavioral: Negative.        Objective:   Physical Exam  Constitutional: She appears well-developed and well-nourished.  HENT:  Head: Normocephalic and atraumatic.  Right Ear: External ear normal.  Left Ear: External ear normal.  Nose: Nose normal.  Mouth/Throat: Oropharynx is clear and moist.  Eyes: EOM are normal. Pupils are equal, round, and reactive to light.  Neck: Normal range of motion. Neck supple. No JVD present. No tracheal deviation present. No thyromegaly  present.  Cardiovascular: Normal rate, regular rhythm, normal heart sounds and intact distal pulses.  Exam reveals no gallop and no friction rub.   No murmur heard. Pulmonary/Chest: Effort normal and breath sounds normal. No stridor. No respiratory distress. She has no wheezes. She has no rales. She exhibits no tenderness.  Abdominal: Soft. Bowel sounds are normal. She exhibits no distension and no mass. There is no tenderness. There is no rebound and no guarding.  Genitourinary:  Bilateral breast exam normal except for scar right breast 12:00 from previous biopsy  Musculoskeletal: Normal range of motion.  Lymphadenopathy:    She has no cervical adenopathy.  Neurological: She is alert. She has normal reflexes. No cranial nerve deficit. She exhibits normal muscle tone. Coordination normal.  Skin: Skin is warm and dry. No rash noted. No erythema. No pallor.  Total body skin exam normal except for scar right lateral thigh where she had a melanoma excised. Also there is some hypopigmented areas where she's had skin lesions removed the reamer not malignant. I can appreciate no abnormalities  Psychiatric: She has a normal mood and affect. Her behavior is normal. Judgment and thought content normal.          Assessment & Plan:  Healthy female  History of melanoma right lateral thigh,,,,,,,,, continue sunscreens,,,,,,,, and annual checkups and check your skin frequently at home  History of mild anxiety....... Celexa 20 mg daily at bedtime  Postmenopausal vaginal dryness...[name] KitchenMarland Kitchen Premarin cream 2-3 times weekly  History of scalp psoriasis......[name] Kitchen Kenalog when necessary.

## 2015-01-17 NOTE — Patient Instructions (Addendum)
Continue current medications  Walk 30 minutes daily,,,,,,,,, avoid fatty foods  Return in one year for general physical exam sooner if any problems  Get your mammogram done at the breast center  Screening bone density,,,,,,,, takes 4-6 weeks to get a report,,,,,,,, we will call you  Take an aspirin tablet daily

## 2015-01-24 ENCOUNTER — Ambulatory Visit (INDEPENDENT_AMBULATORY_CARE_PROVIDER_SITE_OTHER)
Admission: RE | Admit: 2015-01-24 | Discharge: 2015-01-24 | Disposition: A | Discharge status: Home / Self Care | Payer: Medicare Other | Source: Ambulatory Visit | Attending: Family Medicine | Admitting: Family Medicine

## 2015-01-24 DIAGNOSIS — Z780 Asymptomatic menopausal state: Secondary | ICD-10-CM | POA: Diagnosis not present

## 2015-02-21 ENCOUNTER — Telehealth: Payer: Self-pay | Admitting: *Deleted

## 2015-02-21 NOTE — Telephone Encounter (Signed)
Patient would like a x-ray of her wrist that she broke 3 weeks ago.

## 2015-02-22 NOTE — Telephone Encounter (Signed)
It was the patient's ribs and Dr Sherren Mocha spoke with the husband.

## 2015-03-02 ENCOUNTER — Telehealth: Payer: Self-pay | Admitting: *Deleted

## 2015-03-02 NOTE — Telephone Encounter (Signed)
Left a message for the pt to call the office back (need date of last mammogram).

## 2015-06-21 ENCOUNTER — Other Ambulatory Visit: Payer: Self-pay | Admitting: Family Medicine

## 2015-06-21 DIAGNOSIS — Z1231 Encounter for screening mammogram for malignant neoplasm of breast: Secondary | ICD-10-CM

## 2015-06-28 ENCOUNTER — Ambulatory Visit (HOSPITAL_COMMUNITY)
Admission: RE | Admit: 2015-06-28 | Discharge: 2015-06-28 | Disposition: A | Discharge status: Home / Self Care | Payer: 59 | Source: Ambulatory Visit | Attending: Family Medicine | Admitting: Family Medicine

## 2015-06-28 DIAGNOSIS — Z1231 Encounter for screening mammogram for malignant neoplasm of breast: Secondary | ICD-10-CM | POA: Insufficient documentation

## 2015-06-28 IMAGING — MG MM DIGITAL SCREENING BILAT
5 series · 5 of 5 positions shown · non-contrast
Comparison: Previous exam(s).

CLINICAL DATA: Screening.

EXAM:
DIGITAL SCREENING BILATERAL MAMMOGRAM WITH CAD

[R CC]
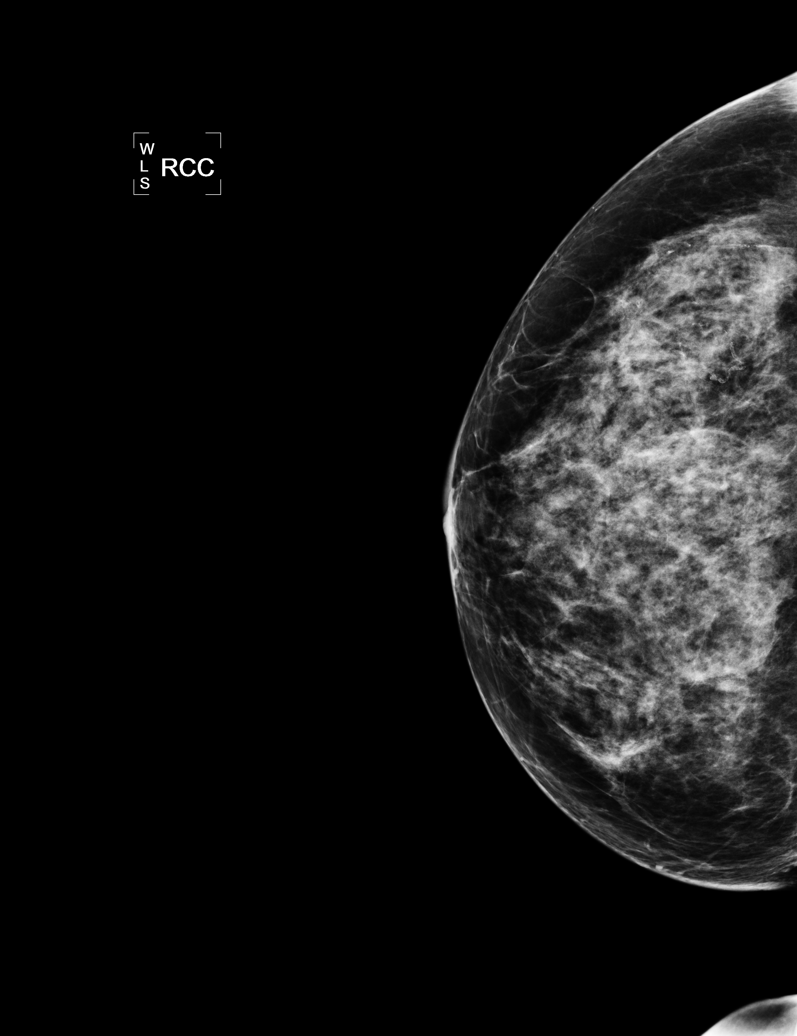

[R MLO]
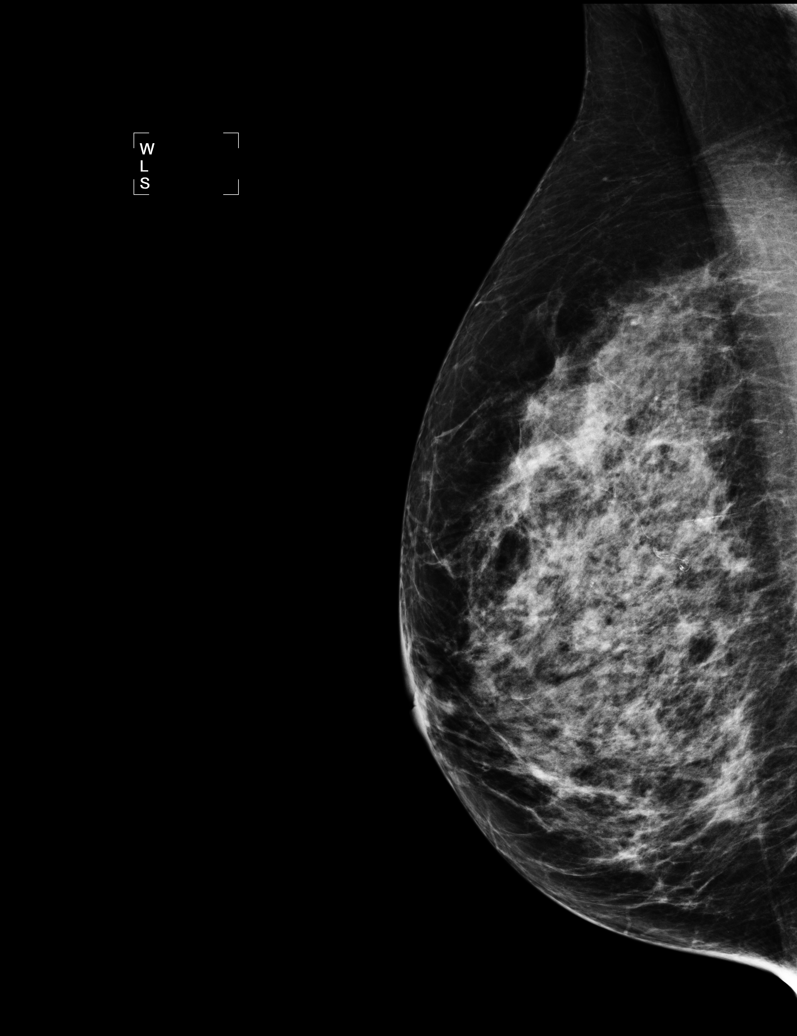

[L CC]
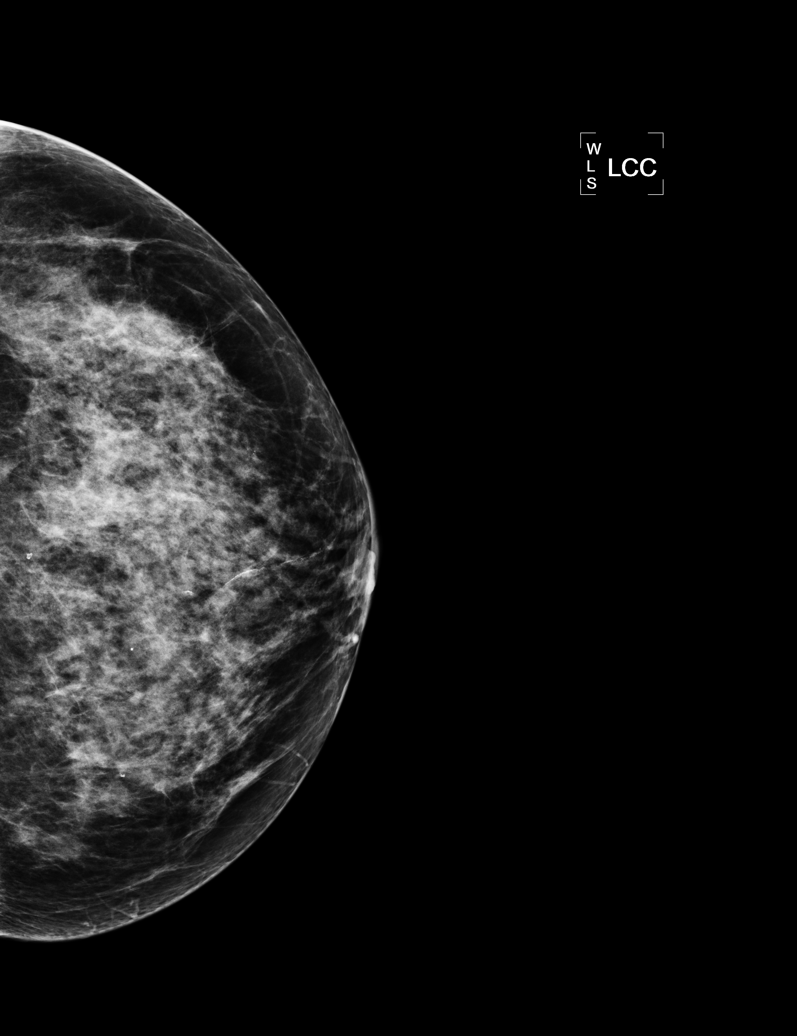

[L MLO (1 of 2)]
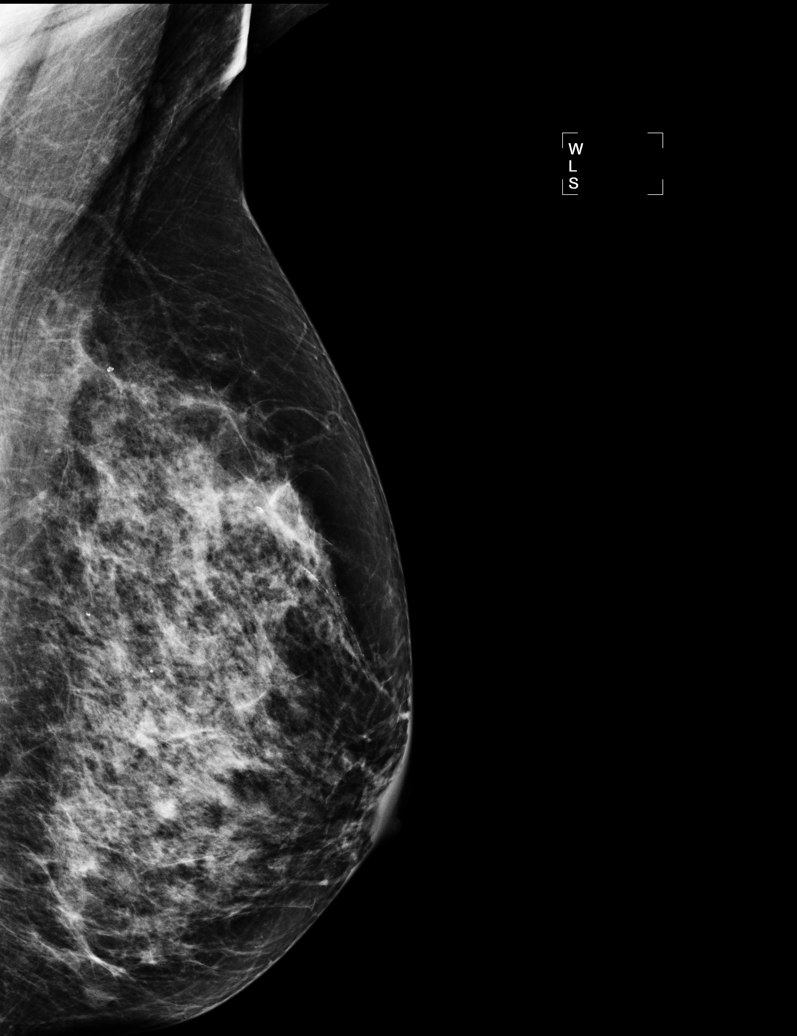

[L MLO (2 of 2)]
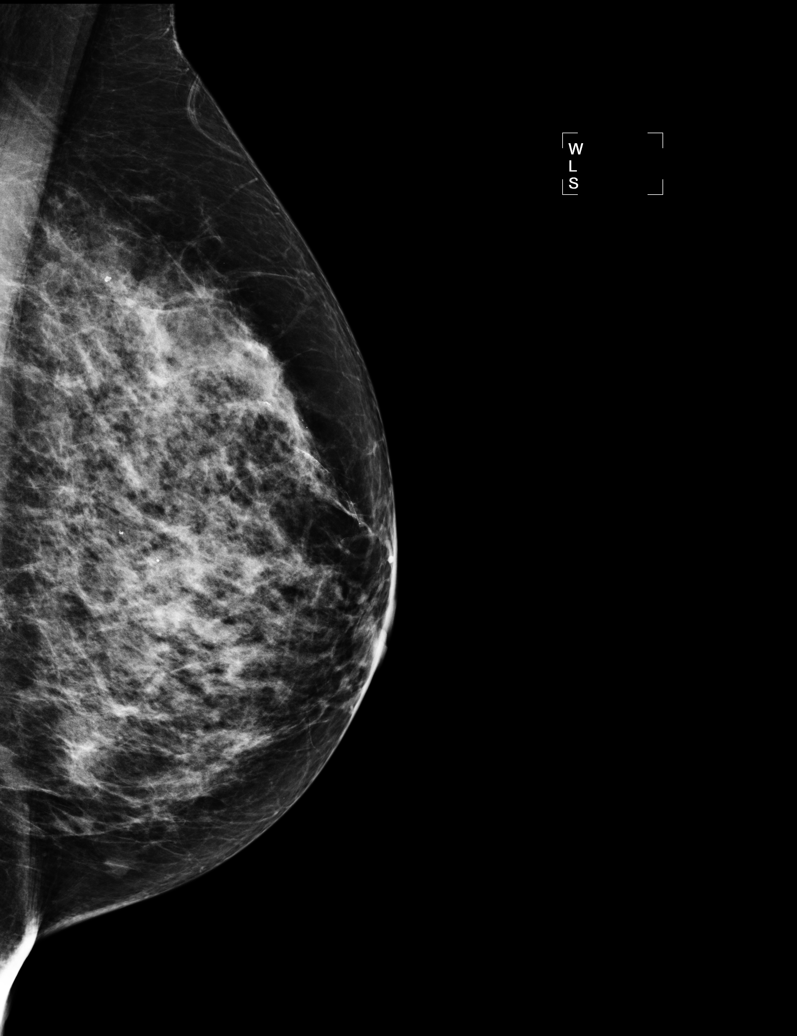

[5 of 5 positions shown; findings below may reference images not displayed]

ACR Breast Density Category d: The breast tissue is extremely dense,
which lowers the sensitivity of mammography.
FINDINGS: There are no findings suspicious for malignancy. Images were
processed with CAD.
IMPRESSION: No mammographic evidence of malignancy. A result letter of this
screening mammogram will be mailed directly to the patient.

RECOMMENDATION:
Screening mammogram in one year. (Code:[ZH])

BI-RADS CATEGORY  1: Negative.

## 2015-11-15 ENCOUNTER — Other Ambulatory Visit: Payer: Self-pay | Admitting: *Deleted

## 2015-11-15 MED ORDER — CITALOPRAM HYDROBROMIDE 20 MG PO TABS: ORAL_TABLET | ORAL | Status: DC

## 2015-12-29 DIAGNOSIS — D2272 Melanocytic nevi of left lower limb, including hip: Secondary | ICD-10-CM | POA: Diagnosis not present

## 2015-12-29 DIAGNOSIS — D2271 Melanocytic nevi of right lower limb, including hip: Secondary | ICD-10-CM | POA: Diagnosis not present

## 2015-12-29 DIAGNOSIS — D2371 Other benign neoplasm of skin of right lower limb, including hip: Secondary | ICD-10-CM | POA: Diagnosis not present

## 2015-12-29 DIAGNOSIS — Z85820 Personal history of malignant melanoma of skin: Secondary | ICD-10-CM | POA: Diagnosis not present

## 2015-12-29 DIAGNOSIS — D2261 Melanocytic nevi of right upper limb, including shoulder: Secondary | ICD-10-CM | POA: Diagnosis not present

## 2015-12-29 DIAGNOSIS — Z419 Encounter for procedure for purposes other than remedying health state, unspecified: Secondary | ICD-10-CM | POA: Diagnosis not present

## 2015-12-29 DIAGNOSIS — D2262 Melanocytic nevi of left upper limb, including shoulder: Secondary | ICD-10-CM | POA: Diagnosis not present

## 2015-12-29 DIAGNOSIS — D2361 Other benign neoplasm of skin of right upper limb, including shoulder: Secondary | ICD-10-CM | POA: Diagnosis not present

## 2015-12-29 DIAGNOSIS — D225 Melanocytic nevi of trunk: Secondary | ICD-10-CM | POA: Diagnosis not present

## 2015-12-29 DIAGNOSIS — L218 Other seborrheic dermatitis: Secondary | ICD-10-CM | POA: Diagnosis not present

## 2015-12-29 DIAGNOSIS — L400 Psoriasis vulgaris: Secondary | ICD-10-CM | POA: Diagnosis not present

## 2015-12-29 DIAGNOSIS — L821 Other seborrheic keratosis: Secondary | ICD-10-CM | POA: Diagnosis not present

## 2016-11-05 ENCOUNTER — Telehealth: Payer: Self-pay | Admitting: Family Medicine

## 2016-11-05 NOTE — Telephone Encounter (Signed)
Pt need new Rx for citalopram #90   Pharm:  [facility]  Pt state that she only has one pill left and the refill had expired at the pharmacy.

## 2016-11-06 ENCOUNTER — Other Ambulatory Visit: Payer: Self-pay | Admitting: Emergency Medicine

## 2016-11-06 MED ORDER — CITALOPRAM HYDROBROMIDE 20 MG PO TABS: ORAL_TABLET | ORAL | 0 refills | Status: DC

## 2016-11-06 NOTE — Telephone Encounter (Signed)
Explained to pt that she needs to make an appointment within the next 90 days to receive any further refills. Pt understood and stated that she will call the office back to make an appointment

## 2016-11-06 NOTE — Telephone Encounter (Signed)
Pt needs to make an appointment within the next 90 days for any further refills

## 2017-01-01 DIAGNOSIS — L400 Psoriasis vulgaris: Secondary | ICD-10-CM | POA: Diagnosis not present

## 2017-01-01 DIAGNOSIS — L821 Other seborrheic keratosis: Secondary | ICD-10-CM | POA: Diagnosis not present

## 2017-01-01 DIAGNOSIS — L218 Other seborrheic dermatitis: Secondary | ICD-10-CM | POA: Diagnosis not present

## 2017-01-01 DIAGNOSIS — L570 Actinic keratosis: Secondary | ICD-10-CM | POA: Diagnosis not present

## 2017-01-01 DIAGNOSIS — D2361 Other benign neoplasm of skin of right upper limb, including shoulder: Secondary | ICD-10-CM | POA: Diagnosis not present

## 2017-01-01 DIAGNOSIS — Z85820 Personal history of malignant melanoma of skin: Secondary | ICD-10-CM | POA: Diagnosis not present

## 2017-01-01 DIAGNOSIS — D225 Melanocytic nevi of trunk: Secondary | ICD-10-CM | POA: Diagnosis not present

## 2017-01-01 DIAGNOSIS — D2371 Other benign neoplasm of skin of right lower limb, including hip: Secondary | ICD-10-CM | POA: Diagnosis not present

## 2017-01-01 DIAGNOSIS — L814 Other melanin hyperpigmentation: Secondary | ICD-10-CM | POA: Diagnosis not present

## 2017-02-11 DIAGNOSIS — F419 Anxiety disorder, unspecified: Secondary | ICD-10-CM | POA: Insufficient documentation

## 2017-02-11 DIAGNOSIS — M8580 Other specified disorders of bone density and structure, unspecified site: Secondary | ICD-10-CM | POA: Insufficient documentation

## 2017-04-16 ENCOUNTER — Ambulatory Visit (INDEPENDENT_AMBULATORY_CARE_PROVIDER_SITE_OTHER): Payer: 59 | Admitting: Family Medicine

## 2017-04-16 ENCOUNTER — Encounter: Payer: Self-pay | Admitting: Family Medicine

## 2017-04-16 VITALS — BP 138/82 | HR 80 | Temp 99.4°F | Ht 64.5 in | Wt 122.0 lb

## 2017-04-16 DIAGNOSIS — F411 Generalized anxiety disorder: Secondary | ICD-10-CM | POA: Diagnosis not present

## 2017-04-16 MED ORDER — CITALOPRAM HYDROBROMIDE 20 MG PO TABS: ORAL_TABLET | ORAL | 3 refills | Status: DC

## 2017-04-16 NOTE — Progress Notes (Signed)
Subjective:     Patient ID: Kerri Taylor, female   DOB: 1950-12-12, 66 y.o.   MRN: [id number]  HPI Patient seen for follow-up regarding some chronic anxiety. She has been apparently for 5 years or longer on Celexa 30 mg daily. She still some breakthrough anxiety at times. Some of this seems social (tends to occur in places like grocery store or crowds) but somewhat more generalized. Does drink some caffeine but tries to minimize. She exercises regularly 2 days per week. Denies any specific stressors.  Past Medical History:  Diagnosis Date  . Anxiety   . Cancer ([place])   . Malignant melanoma of skin of right thigh [facility])    Past Surgical History:  Procedure Laterality Date  . ABDOMINAL HYSTERECTOMY  1990  . CYST REMOVAL NECK  1985  . PATELLA FRACTURE SURGERY  2013   right    reports that she has never smoked. She has never used smokeless tobacco. She reports that she drinks about 1.8 oz of alcohol per week . She reports that she does not use drugs. family history includes Colon cancer (age of onset: 19) in her brother. Allergies  Allergen Reactions  . Codeine Nausea And Vomiting     Review of Systems  Constitutional: Negative for fatigue and unexpected weight change.  Eyes: Negative for visual disturbance.  Respiratory: Negative for cough, chest tightness, shortness of breath and wheezing.   Cardiovascular: Negative for chest pain, palpitations and leg swelling.  Neurological: Negative for dizziness, seizures, syncope, weakness, light-headedness and headaches.       Objective:   Physical Exam  Constitutional: She appears well-developed and well-nourished.  Eyes: Pupils are equal, round, and reactive to light.  Neck: Neck supple. No JVD present. No thyromegaly present.  Cardiovascular: Normal rate and regular rhythm.  Exam reveals no gallop.   Pulmonary/Chest: Effort normal and breath sounds normal. No respiratory distress. She has no wheezes. She has no rales.   Musculoskeletal: She exhibits no edema.  Neurological: She is alert.  Psychiatric: She has a normal mood and affect. Her behavior is normal. Judgment and thought content normal.       Assessment:     Chronic anxiety disorder. Patient states she has some general anxiety which is fairly pervasive and is stable on Celexa    Plan:     -refill Celexa for one year -Patient will schedule physical with her primary provider when he is back  Eulas Post MD [facility] at [facility]

## 2017-05-28 DIAGNOSIS — L570 Actinic keratosis: Secondary | ICD-10-CM | POA: Diagnosis not present

## 2017-05-28 DIAGNOSIS — L814 Other melanin hyperpigmentation: Secondary | ICD-10-CM | POA: Diagnosis not present

## 2017-05-28 DIAGNOSIS — B078 Other viral warts: Secondary | ICD-10-CM | POA: Diagnosis not present

## 2017-05-28 DIAGNOSIS — L821 Other seborrheic keratosis: Secondary | ICD-10-CM | POA: Diagnosis not present

## 2017-05-28 DIAGNOSIS — I788 Other diseases of capillaries: Secondary | ICD-10-CM | POA: Diagnosis not present

## 2017-05-28 DIAGNOSIS — Z85820 Personal history of malignant melanoma of skin: Secondary | ICD-10-CM | POA: Diagnosis not present

## 2017-05-28 DIAGNOSIS — L820 Inflamed seborrheic keratosis: Secondary | ICD-10-CM | POA: Diagnosis not present

## 2017-07-02 ENCOUNTER — Encounter: Payer: Medicare Other | Admitting: Family Medicine

## 2017-07-30 ENCOUNTER — Encounter: Payer: Self-pay | Admitting: Family Medicine

## 2017-07-30 ENCOUNTER — Ambulatory Visit (INDEPENDENT_AMBULATORY_CARE_PROVIDER_SITE_OTHER): Payer: 59 | Admitting: Family Medicine

## 2017-07-30 VITALS — BP 138/86 | HR 72 | Temp 98.4°F | Ht 64.0 in | Wt 124.0 lb

## 2017-07-30 DIAGNOSIS — Z23 Encounter for immunization: Secondary | ICD-10-CM

## 2017-07-30 DIAGNOSIS — N952 Postmenopausal atrophic vaginitis: Secondary | ICD-10-CM | POA: Diagnosis not present

## 2017-07-30 DIAGNOSIS — F411 Generalized anxiety disorder: Secondary | ICD-10-CM

## 2017-07-30 DIAGNOSIS — L409 Psoriasis, unspecified: Secondary | ICD-10-CM | POA: Diagnosis not present

## 2017-07-30 DIAGNOSIS — C4371 Malignant melanoma of right lower limb, including hip: Secondary | ICD-10-CM | POA: Diagnosis not present

## 2017-07-30 DIAGNOSIS — Z0000 Encounter for general adult medical examination without abnormal findings: Secondary | ICD-10-CM | POA: Diagnosis not present

## 2017-07-30 LAB — CBC WITH DIFFERENTIAL/PLATELET
BASOS ABS: 0 10*3/uL (ref 0.0–0.1)
Basophils Relative: 0.5 % (ref 0.0–3.0)
Eosinophils Absolute: 0.1 10*3/uL (ref 0.0–0.7)
Eosinophils Relative: 2.8 % (ref 0.0–5.0)
HCT: 41.2 % (ref 36.0–46.0)
Hemoglobin: 13.8 g/dL (ref 12.0–15.0)
LYMPHS PCT: 31.7 % (ref 12.0–46.0)
Lymphs Abs: 1 10*3/uL (ref 0.7–4.0)
MCHC: 33.4 g/dL (ref 30.0–36.0)
MCV: 95.4 fl (ref 78.0–100.0)
MONOS PCT: 9.2 % (ref 3.0–12.0)
Monocytes Absolute: 0.3 10*3/uL (ref 0.1–1.0)
Neutro Abs: 1.8 10*3/uL (ref 1.4–7.7)
Neutrophils Relative %: 55.8 % (ref 43.0–77.0)
Platelets: 286 10*3/uL (ref 150.0–400.0)
RBC: 4.32 Mil/uL (ref 3.87–5.11)
RDW: 13.3 % (ref 11.5–15.5)
WBC: 3.1 10*3/uL — ABNORMAL LOW (ref 4.0–10.5)

## 2017-07-30 LAB — BASIC METABOLIC PANEL
BUN: 19 mg/dL (ref 6–23)
CALCIUM: 9.2 mg/dL (ref 8.4–10.5)
CHLORIDE: 103 meq/L (ref 96–112)
CO2: 30 mEq/L (ref 19–32)
Creatinine, Ser: 0.68 mg/dL (ref 0.40–1.20)
GFR: 91.25 mL/min (ref >60.00)
Glucose, Bld: 99 mg/dL (ref 70–99)
Potassium: 4.2 mEq/L (ref 3.5–5.1)
SODIUM: 140 meq/L (ref 135–145)

## 2017-07-30 LAB — HEPATIC FUNCTION PANEL
ALT: 14 U/L (ref 0–35)
AST: 17 U/L (ref 0–37)
Albumin: 4.2 g/dL (ref 3.5–5.2)
Alkaline Phosphatase: 60 U/L (ref 39–117)
BILIRUBIN DIRECT: 0.1 mg/dL (ref 0.0–0.3)
Total Bilirubin: 0.5 mg/dL (ref 0.2–1.2)
Total Protein: 6.5 g/dL (ref 6.0–8.3)

## 2017-07-30 LAB — TSH: TSH: 3.11 u[IU]/mL (ref 0.35–4.50)

## 2017-07-30 LAB — POCT URINALYSIS DIPSTICK
Bilirubin, UA: NEGATIVE
Glucose, UA: NEGATIVE
Ketones, UA: NEGATIVE
Leukocytes, UA: NEGATIVE
NITRITE UA: NEGATIVE
Protein, UA: NEGATIVE
RBC UA: NEGATIVE
Spec Grav, UA: 1.02 (ref 1.010–1.025)
Urobilinogen, UA: 0.2 E.U./dL
pH, UA: 6 (ref 5.0–8.0)

## 2017-07-30 LAB — LIPID PANEL
CHOL/HDL RATIO: 4
Cholesterol: 274 mg/dL — ABNORMAL HIGH (ref 0–200)
HDL: 68.7 mg/dL (ref >39.00)
LDL CALC: 185 mg/dL — AB (ref 0–99)
NonHDL: 205.22
Triglycerides: 102 mg/dL (ref 0.0–149.0)
VLDL: 20.4 mg/dL (ref 0.0–40.0)

## 2017-07-30 MED ORDER — CYCLOBENZAPRINE HCL 5 MG PO TABS: 5.0000 mg | ORAL_TABLET | Freq: Every evening | ORAL | 4 refills | Status: DC | PRN

## 2017-07-30 MED ORDER — CITALOPRAM HYDROBROMIDE 20 MG PO TABS: ORAL_TABLET | ORAL | 3 refills | Status: DC

## 2017-07-30 MED ORDER — TRIAMCINOLONE ACETONIDE 0.025 % EX OINT: TOPICAL_OINTMENT | Freq: Two times a day (BID) | CUTANEOUS | 3 refills | Status: AC

## 2017-07-30 MED ORDER — ESTROGENS, CONJUGATED 0.625 MG/GM VA CREA: TOPICAL_CREAM | Freq: Every day | VAGINAL | 12 refills | Status: DC

## 2017-07-30 NOTE — Progress Notes (Signed)
Kerri Taylor is a 66 year old single female nonsmoker who comes in today for annual physical examination because of a history of mild depression, postmenopausal vaginal dryness, psoriasis, and melanoma  We did a physical examiner about 10 years ago and noticed a black lesion posterior right leg. We did a wide excision it turned out to be a melanoma. She underwent Mohs surgery with very Y extension by general surgery. She's done wellness had no recurrence. She sees her dermatologist on a yearly basis. She is originally from [place] has light skin and light eyes  She takes Celexa 30 mg daily at bedtime because a history of mild depression  She takes Flexeril 5 mg at bedtime when necessary for muscle pain  She is 18 years post menopausal. She had her uterus removed for nonmalignant reasons. Ovaries were left intact. She's asymptomatic.  She uses Kenalog cream wonderful psoriasis flares up it's currently in remission  She gets routine eye care, dental care, BSE monthly, last mammogram 2016. Advised annual mammography. Colonoscopy 2014 she had some polyps. Her brother died of colon cancer. She's due to go back for follow-up next year.  Vaccinations tetanus booster 2014 seasonal flu shot and Pneumovax given today. Information given on shingles.  She's requesting a Pap smear however she does not have a uterus. We explained why she does no no longer need Pap smears.  14 point review of systems reviewed and otherwise negative  Bone density was done 2 years ago showed some osteopenia. She takes vitamin D and walks daily.  BP 138/86 (BP Location: Left Arm, Patient Position: Sitting, Cuff Size: Normal)   Pulse 72   Temp 98.4 F (36.9 C) (Oral)   Ht 5\' 4"  (1.626 m)   Wt 124 lb (56.2 kg)   BMI 21.28 kg/m  General she is well-developed well-nourished female no acute distress examination of the HEENT were negative neck was supple thyroid is not enlarged cardiopulmonary exam normal breast exam normal  abdominal exam normal pelvic not indicated rectal not indicated extremities normal skin normal peripheral pulses normal  Because of her history of melanoma total body skin exam was done. I can appreciate no abnormalities. She has a garden variety of freckles mole seborrheic keratosis capillary hemangiomas etc. etc.  #1 depression......... continue Celexa 30 mg daily  #2 postmenopausal vaginal dryness........... Premarin cream twice weekly  #3 history of psoriasis......[name] Kitchen Kenalog when necessary  #4 history of melanoma..... Right posterior thigh 10 years ago......... continue sunscreen and meticulous follow-up

## 2017-07-30 NOTE — Patient Instructions (Signed)
Continue current medications  Continue to do a thorough skin exam monthly at home  Follow up with dermatology and [place] as outlined  Labs today........ I will call you is anything abnormal

## 2017-09-17 ENCOUNTER — Telehealth: Payer: Self-pay | Admitting: Family Medicine

## 2017-09-17 NOTE — Telephone Encounter (Signed)
Copied from [place]. Topic: Bill or Statement - Patient/Guarantor Inquiry >> Sep 17, 2017 10:01 AM [name] wrote: Patient name/MRN/Acct #: [id number] Details of issue or inquiry: 07/30/2017 Route to appropriate [place] or [facility] Coding pool. Pt called in and has question about the coding of the 10/30 service.  She was under the assumption that this was just a CPE.  She has a bill for $670.00.  She would like some one to call her.  I transferred her to billing to talk with them as well

## 2017-11-25 ENCOUNTER — Encounter: Payer: Self-pay | Admitting: Gastroenterology

## 2017-12-26 ENCOUNTER — Ambulatory Visit: Payer: Self-pay

## 2017-12-26 ENCOUNTER — Ambulatory Visit (INDEPENDENT_AMBULATORY_CARE_PROVIDER_SITE_OTHER): Payer: 59 | Admitting: Family Medicine

## 2017-12-26 VITALS — BP 132/80 | HR 93 | Temp 97.9°F

## 2017-12-26 DIAGNOSIS — F419 Anxiety disorder, unspecified: Secondary | ICD-10-CM

## 2017-12-26 NOTE — Progress Notes (Signed)
Subjective:    Patient ID: Kerri Taylor, female    DOB: [birth year], 67 y.o.   MRN: [id number]  No chief complaint on file.   HPI Patient was seen today for acute concern.  Pt endorses lightheadedness, "feeling like I am going to pass out".  Pt states she has had symptoms like this before maybe once a month.  Latest symptoms occurred 2 days in a row.  Pt endorses having symptoms while she was driving to the clinic today pt denies history of anxiety, but taking Celexa.  Pt denies feeling anxious, stress, chest pain, headache, blurred vision, recent illness.  Pt states she is healthy, drinks plenty of water each day, exercises, tries to eat right.  Pt states maybe my celexa needs to be increased.  Past Medical History:  Diagnosis Date  . Anxiety   . Cancer ([place])   . Malignant melanoma of skin of right thigh ([facility])     Allergies  Allergen Reactions  . Codeine Nausea And Vomiting    ROS General: Denies fever, chills, night sweats, changes in weight, changes in appetite   +lightheadedness, "shakey" HEENT: Denies headaches, ear pain, changes in vision, rhinorrhea, sore throat CV: Denies CP, palpitations, SOB, orthopnea Pulm: Denies SOB, cough, wheezing GI: Denies abdominal pain, nausea, vomiting, diarrhea, constipation GU: Denies dysuria, hematuria, frequency, vaginal discharge Msk: Denies muscle cramps, joint pains Neuro: Denies weakness, numbness, tingling Skin: Denies rashes, bruising Psych: Denies depression, anxiety, hallucinations     Objective:    Blood pressure 132/80, pulse 93, temperature 97.9 F (36.6 C), SpO2 98 %.   Gen. Pleasant, well-nourished, in no distress, pt appears anxious as her hands and head are shaking. HEENT: [place]/AT, face symmetric, no scleral icterus, nares patent without drainage Lungs: no accessory muscle use, CTAB, no wheezes or rales Cardiovascular: RRR, no peripheral edema Neuro:  A&Ox3, CN II-XII intact, gait not assessed as pt sitting in  wheelchair.      Wt Readings from Last 3 Encounters:  07/30/17 124 lb (56.2 kg)  04/16/17 122 lb (55.3 kg)  01/17/15 124 lb (56.2 kg)    Lab Results  Component Value Date   WBC 3.1 (L) 07/30/2017   HGB 13.8 07/30/2017   HCT 41.2 07/30/2017   PLT 286.0 07/30/2017   GLUCOSE 99 07/30/2017   CHOL 274 (H) 07/30/2017   TRIG 102.0 07/30/2017   HDL 68.70 07/30/2017   LDLDIRECT 199.7 10/19/2013   LDLCALC 185 (H) 07/30/2017   ALT 14 07/30/2017   AST 17 07/30/2017   NA 140 07/30/2017   K 4.2 07/30/2017   CL 103 07/30/2017   CREATININE 0.68 07/30/2017   BUN 19 07/30/2017   CO2 30 07/30/2017   TSH 3.11 07/30/2017    Assessment/Plan:  Anxiety -PHQ 9 1 -GAD 7 0 -pt encouraged to consider counseling.  Given a list of area providers. -continue celexa 20 mg.  No changes made in dosage given scoring as above. -Pt declined EKG and labs.  F/u with pcp  Grier Mitts, MD

## 2017-12-26 NOTE — Telephone Encounter (Signed)
Noted  

## 2017-12-26 NOTE — Telephone Encounter (Signed)
Patient called in with c/o "palpitations." She says "yesterday I was driving and all of a sudden my heart started beating fast, I felt lightheaded and dizzy like I was going to pass out. I sit there for about 10 minutes while it happened, drank some water and finally it went away and my heart was back to beating normal. I came on home and got some rest, but I was still nervous feeling on the inside. Today, I'm just resting in the house afraid this might happen again if I start moving around doing things. I feel nervous on the inside, like I have to take deep breaths. I don't know what's going on. I would like to see the doctor today, if possible." I asked can she feel her pulse and count the beats and I will time it, pulse was 84, she says "it felt like it was skipping beats or extra beats in there." I asked has she experienced this before, she says "yes and I had the work up and it was anxiety. I am on medicine for it." She denies any other symptoms today. According to protocol, see PCP within 2 weeks, no availability with provider over next few days, patient requests to be seen asap, appointment made for today at 1300 with Dr. Volanda Napoleon, care advice given, patient verbalized understanding.   Reason for Disposition . Problems with anxiety or stress  Answer Assessment - Initial Assessment Questions 1. DESCRIPTION: "Please describe your heart rate or heart beat that you are having" (e.g., fast/slow, regular/irregular, skipped or extra beats, "palpitations")     Yesterday I felt the fluttering, beating fast; today it's normal so far, just feel nervous inside 2. ONSET: "When did it start?" (Minutes, hours or days)      Yesterday 3. DURATION: "How long does it last" (e.g., seconds, minutes, hours)     About 10 minutes or more 4. PATTERN "Does it come and go, or has it been constant since it started?"  "Does it get worse with exertion?"   "Are you feeling it now?"     Come and go; right now I feel nervous on  the inside, but my heart is not racing 5. TAP: "Using your hand, can you tap out what you are feeling on a chair or table in front of you, so that I can hear?" (Note: not all patients can do this)       N/A 6. HEART RATE: "Can you tell me your heart rate?" "How many beats in 15 seconds?"  (Note: not all patients can do this)       84-felt extra beats 7. RECURRENT SYMPTOM: "Have you ever had this before?" If so, ask: "When was the last time?" and "What happened that time?"      Yes 8. CAUSE: "What do you think is causing the palpitations?"     I don't know 9. CARDIAC HISTORY: "Do you have any history of heart disease?" (e.g., heart attack, angina, bypass surgery, angioplasty, arrhythmia)      No 10. OTHER SYMPTOMS: "Do you have any other symptoms?" (e.g., dizziness, chest pain, sweating, difficulty breathing)       Yesterday I was dizzy, lightheaded; today nothing, just feel nervous. 11. PREGNANCY: "Is there any chance you are pregnant?" "When was your last menstrual period?"       No  Protocols used: HEART RATE AND HEARTBEAT QUESTIONS-A-AH

## 2017-12-26 NOTE — Patient Instructions (Signed)
Panic Attack A panic attack is a sudden episode of severe anxiety, fear, or discomfort that causes physical and emotional symptoms. The attack may be in response to something frightening, or it may occur for no known reason. Symptoms of a panic attack can be similar to symptoms of a heart attack or stroke. It is important to see your health care provider when you have a panic attack so that these conditions can be ruled out. A panic attack is a symptom of another condition. Most panic attacks go away with treatment of the underlying problem. If you have panic attacks often, you may have a condition called panic disorder. What are the causes? A panic attack may be caused by:  An extreme, life-threatening situation, such as a war or natural disaster.  An anxiety disorder, such as post-traumatic stress disorder.  Depression.  Certain medical conditions, including heart problems, neurological conditions, and infections.  Certain over-the-counter and prescription medicines.  Illegal drugs that increase heart rate and blood pressure, such as methamphetamine.  Alcohol.  Supplements that increase anxiety.  Panic disorder.  What increases the risk? You are more likely to develop this condition if:  You have an anxiety disorder.  You have another mental health condition.  You take certain medicines.  You use alcohol, illegal drugs, or other substances.  You are under extreme stress.  A life event is causing increased feelings of anxiety and depression.  What are the signs or symptoms? A panic attack starts suddenly, usually lasts about 20 minutes, and occurs with one or more of the following:  A pounding heart.  A feeling that your heart is beating irregularly or faster than normal (palpitations).  Sweating.  Trembling or shaking.  Shortness of breath or feeling smothered.  Feeling choked.  Chest pain or discomfort.  Nausea or a strange feeling in your  stomach.  Dizziness, feeling lightheaded, or feeling like you might faint.  Chills or hot flashes.  Numbness or tingling in your lips, hands, or feet.  Feeling confused, or feeling that you are not yourself.  Fear of losing control or being emotionally unstable.  Fear of dying.  How is this diagnosed? A panic attack is diagnosed with an assessment by your health care provider. During the assessment your health care provider will ask questions about:  Your history of anxiety, depression, and panic attacks.  Your medical history.  Whether you drink alcohol, use illegal drugs, take supplements, or take medicines. Be honest about your substance use.  Your health care provider may also:  Order blood tests or other kinds of tests to rule out serious medical conditions.  Refer you to a mental health professional for further evaluation.  How is this treated? Treatment depends on the cause of the panic attack:  If the cause is a medical problem, your health care provider will either treat that problem or refer you to a specialist.  If the cause is emotional, you may be given anti-anxiety medicines or referred to a counselor. These medicines may reduce how often attacks happen, reduce how severe the attacks are, and lower anxiety.  If the cause is a medicine, your health care provider may tell you to stop the medicine, change your dose, or take a different medicine.  If the cause is a drug, treatment may involve letting the drug wear off and taking medicine to help the drug leave your body or to counteract its effects. Attacks caused by drug abuse may continue even if you stop using   the drug.  Follow these instructions at home:  Take over-the-counter and prescription medicines only as told by your health care provider.  If you feel anxious, limit your caffeine intake.  Take good care of your physical and mental health by: ? Eating a balanced diet that includes plenty of fresh  fruits and vegetables, whole grains, lean meats, and low-fat dairy. ? Getting plenty of rest. Try to get 7-8 hours of uninterrupted sleep each night. ? Exercising regularly. Try to get 30 minutes of physical activity at least 5 days a week. ? Not smoking. Talk to your health care provider if you need help quitting. ? Limiting alcohol intake to no more than 1 drink a day for nonpregnant women and 2 drinks a day for men. One drink equals 12 oz of beer, 5 oz of wine, or 1 oz of hard liquor.  Keep all follow-up visits as told by your health care provider. This is important. Panic attacks may have underlying physical or emotional problems that take time to accurately diagnose. Contact a health care provider if:  Your symptoms do not improve, or they get worse.  You are not able to take your medicine as prescribed because of side effects. Get help right away if:  You have serious thoughts about hurting yourself or others.  You have symptoms of a panic attack. Do not drive yourself to the hospital. Have someone else drive you or call an ambulance. If you ever feel like you may hurt yourself or others, or you have thoughts about taking your own life, get help right away. You can go to your nearest emergency department or call:  Your local emergency services (911 in the [place]  A suicide crisis helpline, such as the National Suicide Prevention Lifeline at 1-800-273-8255. This is open 24 hours a day.  Summary  A panic attack is a sign of a serious health or mental health condition. Get help right away. Do not drive yourself to the hospital. Have someone else drive you or call an ambulance.  Always see a health care provider to have the reasons for the panic attack correctly diagnosed.  If your panic attack was caused by a physical problem, follow your health care provider's suggestions for medicine, referral to a specialist, and lifestyle changes.  If your panic attack was caused by an  emotional problem, follow through with counseling from a qualified mental health specialist.  If you feel like you may hurt yourself or others, call 911 and get help right away. This information is not intended to replace advice given to you by your health care provider. Make sure you discuss any questions you have with your health care provider. Document Released: 09/17/2005 Document Revised: 10/26/2016 Document Reviewed: 10/26/2016 Elsevier Interactive Patient Education  2018 [employer]vier Inc.  

## 2017-12-27 ENCOUNTER — Encounter: Payer: Self-pay | Admitting: Family Medicine

## 2018-07-18 DIAGNOSIS — H2513 Age-related nuclear cataract, bilateral: Secondary | ICD-10-CM | POA: Diagnosis not present

## 2018-07-18 DIAGNOSIS — H5203 Hypermetropia, bilateral: Secondary | ICD-10-CM | POA: Diagnosis not present

## 2018-07-30 ENCOUNTER — Other Ambulatory Visit: Payer: Self-pay | Admitting: Family Medicine

## 2018-07-30 DIAGNOSIS — R69 Illness, unspecified: Secondary | ICD-10-CM | POA: Diagnosis not present

## 2018-08-05 ENCOUNTER — Encounter: Payer: Medicare Other | Admitting: Family Medicine

## 2018-08-05 DIAGNOSIS — Z0289 Encounter for other administrative examinations: Secondary | ICD-10-CM

## 2018-08-07 ENCOUNTER — Other Ambulatory Visit: Payer: Self-pay | Admitting: Family Medicine

## 2018-08-07 NOTE — Telephone Encounter (Signed)
TC to patient. Left message to call and schedule appointment in order for Celexa to be refilled at this time.

## 2018-08-07 NOTE — Telephone Encounter (Signed)
Requested medication (s) are due for refill today: Yes  Requested medication (s) are on the active medication list: Yes  Last refill:  07/30/17  Future visit scheduled: Yes  Notes to clinic:  Unable to refill, expired Rx     Requested Prescriptions  Pending Prescriptions Disp Refills   citalopram (CELEXA) 20 MG tablet 135 tablet 3    Sig: 1-1/2 tablets at bedtime     Psychiatry:  Antidepressants - SSRI Failed - 08/07/2018  4:18 PM      Failed - Valid encounter within last 6 months    Recent Outpatient Visits          7 months ago [place] at [facility], Langley Adie, MD   1 year ago [place]AG[place], ATROPHIC, [place] at [facility], Jory Ee, MD   1 year ago [place], [place] at [facility], Alinda Sierras, MD   3 years ago Need for prophylactic vaccination against Streptococcus pneumoniae (pneumococcus)   [facility]rap[place], [place] at [facility], Jory Ee, MD   4 years ago [place] at [place], MD      Future Appointments            In 6 days Sherren Mocha, Jory Ee, MD [employer] at [place], [facility]

## 2018-08-07 NOTE — Telephone Encounter (Signed)
Copied from [place] [id number]. Topic: Quick Communication - Rx Refill/Question >> Aug 07, 2018  3:16 PM Reyne Dumas L wrote: Medication: citalopram (CELEXA) 20 MG tablet  Has the patient contacted their pharmacy? Yes - out of refills (Agent: If no, request that the patient contact the pharmacy for the refill.) (Agent: If yes, when and what did the pharmacy advise?)  Preferred Pharmacy (with phone number or street name): [facility] - [place], [place]. [phone] (Phone) [phone] (Fax)  Agent: Please be advised that RX refills may take up to 3 business days. We ask that you follow-up with your pharmacy.

## 2018-08-11 NOTE — Telephone Encounter (Signed)
Okay for refill? Please advise 

## 2018-08-12 NOTE — Telephone Encounter (Signed)
Ok for refill? Please advise 

## 2018-08-13 ENCOUNTER — Encounter: Payer: Self-pay | Admitting: Family Medicine

## 2018-08-13 ENCOUNTER — Ambulatory Visit (INDEPENDENT_AMBULATORY_CARE_PROVIDER_SITE_OTHER): Payer: Medicare HMO | Admitting: Family Medicine

## 2018-08-13 ENCOUNTER — Other Ambulatory Visit (HOSPITAL_COMMUNITY)
Admission: RE | Admit: 2018-08-13 | Discharge: 2018-08-13 | Disposition: A | Discharge status: Home / Self Care | Payer: Medicare HMO | Source: Ambulatory Visit | Attending: Family Medicine | Admitting: Family Medicine

## 2018-08-13 VITALS — BP 150/80 | HR 96 | Temp 99.3°F | Wt 126.2 lb

## 2018-08-13 DIAGNOSIS — Z85828 Personal history of other malignant neoplasm of skin: Secondary | ICD-10-CM | POA: Diagnosis not present

## 2018-08-13 DIAGNOSIS — F411 Generalized anxiety disorder: Secondary | ICD-10-CM | POA: Diagnosis not present

## 2018-08-13 DIAGNOSIS — D225 Melanocytic nevi of trunk: Secondary | ICD-10-CM | POA: Diagnosis not present

## 2018-08-13 DIAGNOSIS — R69 Illness, unspecified: Secondary | ICD-10-CM | POA: Diagnosis not present

## 2018-08-13 MED ORDER — CITALOPRAM HYDROBROMIDE 40 MG PO TABS: 40.0000 mg | ORAL_TABLET | Freq: Every day | ORAL | 3 refills | Status: DC

## 2018-08-13 NOTE — Patient Instructions (Signed)
I would call our behavioral health division and set up appointment with 1 of her folks who does CBT to help relieve your anxiety attacks  In the meantime continue the Celexa 40 mg daily  Call in 2 weeks and ask for Tillie Rung or Apolonio Schneiders to get a report on the lesion removed today.

## 2018-08-13 NOTE — Progress Notes (Signed)
Kerri Taylor is a 67 year old married female non-smoker comes in today for the following issues  She has a history of anxiety attacks.  She is currently on Celexa 40 mg daily.  She was on 30 mg daily but was having a lot of breakthrough attacks.  She increase the dose and feels better.  We discussed various options including CBT.  She would like to pursue that  She has a history of a malignant melanoma on her right posterior thigh.  She would like a skin exam.  She has a new lesion on her left breast that she would like removed.  She also has 3 seborrheic keratoses ~anterior chest wall and one on the left side of her scalp  She is having difficulty with her ear she would like her ears checked.  She had a uterus removed many years ago so she no longer needs any pelvics or Paps  Seasonal flu shot given today information given on the new shingles vaccine  Family history unchanged,,,,,, mother had a history of depression her sister had history of depression and committed suicide.  She is married to a man who is 54 years younger.  He recently lost his job but she says that is not a stressor for her.  She used to work full-time cleaning houses.  She now he does not on a part-time basis.  She goes to boot camp 3 times a week.  She is extremely physically active.  .vs BP (!) 150/80 (BP Location: Right Arm, Patient Position: Sitting, Cuff Size: Normal)   Pulse 96   Temp 99.3 F (37.4 C) (Oral)   Wt 126 lb 3.2 oz (57.2 kg)   SpO2 98%   BMI 21.66 kg/m   Well-developed well-nourished female no acute distress vital signs stable she is afebrile a total body skin exam was normal except for scar right posterior lateral thigh from previous melanoma removal years ago.  She also has a scar on her right anterior knee from previous patellar fracture surgery.  She also has some scars in her back from lesions were removed previously.  The 3 lesions that we discussed are the 2 seborrheic keratosis on her anterior  chest wall that are about 5 mm's by 5 mm's.  The lesion in her left side of her scalp above her ear is about 15 mm's by 15 mm's.  The lesion on her anterior chest wall that she is concerned about was removed  She was taken the treatment room.  After informed consent the lesion was cleaned with alcohol and anesthetized with 1% Xylocaine with epinephrine.  It was removed in toto.  The lesion was sent for pathologic analysis.  The base was cauterized Band-Aid was applied.  ENT exam was negative except for dry ear canals and some wax in her left ear which she will remove at home  1.  Anxiety attacks..... Continue Celexa...[name] KitchenMarland KitchenMarland Kitchen recommended CBT  2.  History of malignant melanoma....... normal skin exam except for lesion left anterior chest wall which was removed as noted above  3.  Seborrheic keratosis x3...[name] KitchenMarland KitchenMarland Kitchen treated with cryo  Vaccinations........ seasonal flu shot given today information given on the shingles vaccine.  She had a colonoscopy 2014 which showed a couple polyps.  She is due to go back for follow-up colonoscopy.

## 2018-09-29 ENCOUNTER — Telehealth: Payer: Self-pay | Admitting: *Deleted

## 2018-09-29 NOTE — Telephone Encounter (Signed)
Prior auth for Cyclobenzaprine 5mg  sent to [email or web address].

## 2018-10-27 ENCOUNTER — Telehealth: Payer: Self-pay | Admitting: Family Medicine

## 2018-10-27 NOTE — Telephone Encounter (Signed)
Copied from [place]. Topic: Quick Communication - Rx Refill/Question >> Oct 27, 2018  2:54 PM Bea Graff, NT wrote: Medication: citalopram (CELEXA) 40 MG tablet   Has the patient contacted their pharmacy? Yes.   (Agent: If no, request that the patient contact the pharmacy for the refill.) (Agent: If yes, when and what did the pharmacy advise?)  Preferred Pharmacy (with phone number or street name): [id number] [address], [place]. [phone] (Phone) [phone] (Fax)    Agent: Please be advised that RX refills may take up to 3 business days. We ask that you follow-up with your pharmacy.

## 2018-11-03 NOTE — Telephone Encounter (Signed)
Pt calling back to [facility]  Pt did NOT need a refill, it had refills.  She needs to change pharmacies due to insurance.  From [facility]ri Taylor [facility]  To [facility] [place]

## 2018-11-03 NOTE — Telephone Encounter (Signed)
Patient is requesting a transfer of remaining Rx to new pharmacy- need to transfer under another PCP because Dr Sherren Mocha wrote Rx.

## 2018-11-04 MED ORDER — CITALOPRAM HYDROBROMIDE 40 MG PO TABS: 40.0000 mg | ORAL_TABLET | Freq: Every day | ORAL | 1 refills | Status: DC

## 2019-03-17 DIAGNOSIS — D225 Melanocytic nevi of trunk: Secondary | ICD-10-CM | POA: Diagnosis not present

## 2019-03-17 DIAGNOSIS — Z85820 Personal history of malignant melanoma of skin: Secondary | ICD-10-CM | POA: Diagnosis not present

## 2019-03-17 DIAGNOSIS — L820 Inflamed seborrheic keratosis: Secondary | ICD-10-CM | POA: Diagnosis not present

## 2019-03-17 DIAGNOSIS — L821 Other seborrheic keratosis: Secondary | ICD-10-CM | POA: Diagnosis not present

## 2019-03-17 DIAGNOSIS — D2271 Melanocytic nevi of right lower limb, including hip: Secondary | ICD-10-CM | POA: Diagnosis not present

## 2019-03-17 DIAGNOSIS — D2262 Melanocytic nevi of left upper limb, including shoulder: Secondary | ICD-10-CM | POA: Diagnosis not present

## 2019-03-17 DIAGNOSIS — L570 Actinic keratosis: Secondary | ICD-10-CM | POA: Diagnosis not present

## 2019-03-17 DIAGNOSIS — D2371 Other benign neoplasm of skin of right lower limb, including hip: Secondary | ICD-10-CM | POA: Diagnosis not present

## 2019-04-24 ENCOUNTER — Encounter: Payer: Self-pay | Admitting: Gastroenterology

## 2019-04-27 ENCOUNTER — Ambulatory Visit (AMBULATORY_SURGERY_CENTER): Payer: Self-pay

## 2019-04-27 ENCOUNTER — Other Ambulatory Visit: Payer: Self-pay

## 2019-04-27 VITALS — Ht 64.0 in | Wt 125.0 lb

## 2019-04-27 DIAGNOSIS — Z86010 Personal history of colonic polyps: Secondary | ICD-10-CM

## 2019-04-27 DIAGNOSIS — Z800 Family history of malignant neoplasm of digestive organs: Secondary | ICD-10-CM

## 2019-04-27 MED ORDER — SUPREP BOWEL PREP KIT 17.5-3.13-1.6 GM/177ML PO SOLN: 1.0000 | Freq: Once | ORAL | 0 refills | Status: AC

## 2019-04-27 NOTE — Progress Notes (Signed)
Per pt, no allergies to soy or egg products.Pt not taking any weight loss meds or using  O2 at home. Pt denies sedation problems.  Pt refused emmi video.  Pt came into the office for her PV today!

## 2019-05-04 ENCOUNTER — Telehealth: Payer: Self-pay | Admitting: Gastroenterology

## 2019-05-04 NOTE — Telephone Encounter (Signed)

## 2019-05-04 NOTE — Telephone Encounter (Signed)
Pt responded "no" to all screening questions °

## 2019-05-05 ENCOUNTER — Ambulatory Visit (AMBULATORY_SURGERY_CENTER): Payer: Medicare HMO | Admitting: Gastroenterology

## 2019-05-05 ENCOUNTER — Encounter: Payer: Self-pay | Admitting: Gastroenterology

## 2019-05-05 ENCOUNTER — Other Ambulatory Visit: Payer: Self-pay

## 2019-05-05 VITALS — BP 127/75 | HR 72 | Temp 98.9°F | Resp 10 | Ht 64.0 in | Wt 125.0 lb

## 2019-05-05 DIAGNOSIS — Z86010 Personal history of colonic polyps: Secondary | ICD-10-CM

## 2019-05-05 DIAGNOSIS — Z1211 Encounter for screening for malignant neoplasm of colon: Secondary | ICD-10-CM | POA: Diagnosis not present

## 2019-05-05 DIAGNOSIS — Z800 Family history of malignant neoplasm of digestive organs: Secondary | ICD-10-CM

## 2019-05-05 LAB — HM COLONOSCOPY

## 2019-05-05 MED ORDER — SODIUM CHLORIDE 0.9 % IV SOLN: 500.0000 mL | Freq: Once | INTRAVENOUS | Status: DC

## 2019-05-05 NOTE — Patient Instructions (Signed)
YOU HAD AN ENDOSCOPIC PROCEDURE TODAY AT THE [facility]:   Refer to the procedure report that was given to you for any specific questions about what was found during the examination.  If the procedure report does not answer your questions, please call your gastroenterologist to clarify.  If you requested that your care partner not be given the details of your procedure findings, then the procedure report has been included in a sealed envelope for you to review at your convenience later.  YOU SHOULD EXPECT: Some feelings of bloating in the abdomen. Passage of more gas than usual.  Walking can help get rid of the air that was put into your GI tract during the procedure and reduce the bloating. If you had a lower endoscopy (such as a colonoscopy or flexible sigmoidoscopy) you may notice spotting of blood in your stool or on the toilet paper. If you underwent a bowel prep for your procedure, you may not have a normal bowel movement for a few days.  Please Note:  You might notice some irritation and congestion in your nose or some drainage.  This is from the oxygen used during your procedure.  There is no need for concern and it should clear up in a day or so.  SYMPTOMS TO REPORT IMMEDIATELY:   Following lower endoscopy (colonoscopy or flexible sigmoidoscopy):  Excessive amounts of blood in the stool  Significant tenderness or worsening of abdominal pains  Swelling of the abdomen that is new, acute  Fever of 100F or higher  For urgent or emergent issues, a gastroenterologist can be reached at any hour by calling [phone].   DIET:  We do recommend a small meal at first, but then you may proceed to your regular diet.  Drink plenty of fluids but you should avoid alcoholic beverages for 24 hours.  ACTIVITY:  You should plan to take it easy for the rest of today and you should NOT DRIVE or use heavy machinery until tomorrow (because of the sedation medicines used during the test).     FOLLOW UP: Our staff will call the number listed on your records 48-72 hours following your procedure to check on you and address any questions or concerns that you may have regarding the information given to you following your procedure. If we do not reach you, we will leave a message.  We will attempt to reach you two times.  During this call, we will ask if you have developed any symptoms of COVID 19. If you develop any symptoms (ie: fever, flu-like symptoms, shortness of breath, cough etc.) before then, please call [phone].  If you test positive for Covid 19 in the 2 weeks post procedure, please call and report this information to us.    If any biopsies were taken you will be contacted by phone or by letter within the next 1-3 weeks.  Please call us at [phone] if you have not heard about the biopsies in 3 weeks.    SIGNATURES/CONFIDENTIALITY: You and/or your care partner have signed paperwork which will be entered into your electronic medical record.  These signatures attest to the fact that that the information above on your After Visit Summary has been reviewed and is understood.  Full responsibility of the confidentiality of this discharge information lies with you and/or your care-partner. 

## 2019-05-05 NOTE — Progress Notes (Signed)
Pt's states no medical or surgical changes since previsit or office visit.  June Bullock took temp and Nancy Campbell took vitals. 

## 2019-05-05 NOTE — Op Note (Signed)
[facility] Patient Name: Kerri Taylor Procedure Date: 05/05/2019 2:28 PM MRN: [id number] Endoscopist: Ladene Artist , MD Age: 68 Referring MD:  Date of Birth: Aug 30, 1951 Gender: Female Account #: [id number] Procedure:                Colonoscopy Indications:              High risk colon cancer surveillance: Personal                            history of traditional serrated adenoma of the                            colon. Family history of colon cancer, brother. Medicines:                Monitored Anesthesia Care Procedure:                Pre-Anesthesia Assessment:                           - Prior to the procedure, a History and Physical                            was performed, and patient medications and                            allergies were reviewed. The patient's tolerance of                            previous anesthesia was also reviewed. The risks                            and benefits of the procedure and the sedation                            options and risks were discussed with the patient.                            All questions were answered, and informed consent                            was obtained. Prior Anticoagulants: The patient has                            taken no previous anticoagulant or antiplatelet                            agents. ASA Grade Assessment: II - A patient with                            mild systemic disease. After reviewing the risks                            and benefits, the patient was deemed in  satisfactory condition to undergo the procedure.                           After obtaining informed consent, the colonoscope                            was passed under direct vision. Throughout the                            procedure, the patient's blood pressure, pulse, and                            oxygen saturations were monitored continuously. The                            Colonoscope was  introduced through the anus and                            advanced to the the cecum, identified by                            appendiceal orifice and ileocecal valve. The                            ileocecal valve, appendiceal orifice, and rectum                            were photographed. The quality of the bowel                            preparation was excellent. The colonoscopy was                            performed without difficulty. The patient tolerated                            the procedure well. Scope In: 2:44:02 PM Scope Out: 3:00:21 PM Scope Withdrawal Time: 0 hours 12 minutes 34 seconds  Total Procedure Duration: 0 hours 16 minutes 19 seconds  Findings:                 The perianal and digital rectal examinations were                            normal.                           A few small-mouthed diverticula were found in the                            left colon.                           The exam was otherwise without abnormality on  direct and retroflexion views. Complications:            No immediate complications. Estimated blood loss:                            None. Estimated Blood Loss:     Estimated blood loss: none. Impression:               - Mild left colon diverticulosis.                           - The colon was otherwise normal on direct and                            retroflexion views.                           - No specimens collected. Recommendation:           - Repeat colonoscopy in 5 years for surveillance.                           - Patient has a contact number available for                            emergencies. The signs and symptoms of potential                            delayed complications were discussed with the                            patient. Return to normal activities tomorrow.                            Written discharge instructions were provided to the                            patient.                            - High fiber diet.                           - Continue present medications. Ladene Artist, MD 05/05/2019 3:06:17 PM This report has been signed electronically.

## 2019-05-05 NOTE — Progress Notes (Signed)
To PACU, VSS. Report to [email or web address] 

## 2019-05-07 ENCOUNTER — Telehealth: Payer: Self-pay

## 2019-05-07 NOTE — Telephone Encounter (Signed)
  Follow up Call-  Call back number 05/05/2019  Post procedure Call Back phone  # [phone]  Permission to leave phone message Yes  Some recent data might be hidden     Patient questions:  Do you have a fever, pain , or abdominal swelling? No. Pain Score  0 *  Have you tolerated food without any problems? Yes.    Have you been able to return to your normal activities? Yes.    Do you have any questions about your discharge instructions: Diet   No. Medications  No. Follow up visit  No.  Do you have questions or concerns about your Care? No.  Actions: * If pain score is 4 or above: No action needed, pain <4. 1. Have you developed a fever since your procedure? no  2.   Have you had an respiratory symptoms (SOB or cough) since your procedure? no  3.   Have you tested positive for COVID 19 since your procedure no  4.   Have you had any family members/close contacts diagnosed with the COVID 19 since your procedure?  no   If yes to any of these questions please route to Joylene John, RN and Alphonsa Gin, Therapist, sports.

## 2019-05-07 NOTE — Telephone Encounter (Signed)
Left voice mail

## 2019-06-04 ENCOUNTER — Ambulatory Visit: Payer: Medicare HMO | Admitting: Family Medicine

## 2019-06-19 ENCOUNTER — Ambulatory Visit: Payer: Medicare HMO | Admitting: Family Medicine

## 2019-07-28 NOTE — Progress Notes (Signed)
Subjective:    Patient ID: Kerri Taylor, female    DOB: 21-Sep-1951, 68 y.o.   MRN: [id number]  HPI:  Kerri Taylor is here to establish as a new pt.  She is a pleasant 68 year old female. PMH:GAD, Osteoarthritis She has been on Citalopram 40mg  QD for >10 years-reports stable mood, denies SI/HI She is not working with a therapist, declines referral. She is quite active- daily walking, house/yard work and [employer] homes 3 days/week. She was participating in "[employer]" twice weekly prior to pandemic shut down. She has hx of melanoma- followed by dermatology Q6M She estimates to drink >60 oz water + green tea/day She follows a heart healthy diet. She denies tobacco/vape use. She enjoys a few cocktails per week.   Patient Care Team    Relationship Specialty Notifications Start End  Mina Marble D, NP PCP - General Family Medicine  07/29/19   Sydnee Levans, MD Referring Physician Dermatology  07/29/19   Gatha Mayer, MD Consulting Physician Gastroenterology  07/29/19     Patient Active Problem List   Diagnosis Date Noted  . Healthcare maintenance 07/29/2019  . History of skin cancer 08/13/2018  . Routine general medical examination at a health care facility 07/30/2017  . Malignant melanoma of skin of right thigh ([place])   . Osteoarthritis 08/21/2012  . MELANOMA, STAGE II 07/20/2010  . SEBORRHEIC KERATOSIS, INFLAMED 07/17/2010  . LENTIGO 07/17/2010  . 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 ANXIETY DISORDER, GENERALIZED 12/01/2009  . VAGINITIS, ATROPHIC, POSTMENOPAUSAL 12/01/2009  . DYSHIDROSIS 12/01/2009     Past Medical History:  Diagnosis Date  . Anxiety   . Cancer ([place])   . Malignant melanoma of skin of right thigh [facility])      Past Surgical History:  Procedure Laterality Date  . ABDOMINAL HYSTERECTOMY  1990  . BREAST BIOPSY     cyst removed right breast/benign  . CYST REMOVAL NECK  1985   back of neck, benign  . PATELLA FRACTURE SURGERY  2013   right     Family History  Problem Relation Age of Onset  . Colon cancer Brother 33  . Aneurysm Mother   . Healthy Sister   . Suicidality Sister   . Healthy Brother   . Healthy Brother   . Healthy Brother   . Esophageal cancer Neg Hx   . Rectal cancer Neg Hx   . Stomach cancer Neg Hx      Social History   Substance and Sexual Activity  Drug Use No     Social History   Substance and Sexual Activity  Alcohol Use Yes  . Alcohol/week: 3.0 standard drinks  . Types: 3 Cans of beer per week   Comment: occasional     Social History   Tobacco Use  Smoking Status Never Smoker  Smokeless Tobacco Never Used     Outpatient Encounter Medications as of 07/29/2019  Medication Sig  . cholecalciferol (VITAMIN D3) 25 MCG (1000 UT) tablet Take 1,000 Units by mouth daily.  . citalopram (CELEXA) 40 MG tablet Take 1 tablet (40 mg total) by mouth daily. Please establish with a new provider for further refills  . conjugated estrogens (PREMARIN) vaginal cream Place vaginally daily. (Patient not taking: Reported on 04/27/2019)  . cyclobenzaprine (FLEXERIL) 5 MG tablet Take 1 tablet (5 mg total) by mouth at bedtime as needed.  . triamcinolone (KENALOG) 0.025 % ointment Apply topically 2 (two) times daily. (Patient not taking: Reported on 04/27/2019)  . [  DISCONTINUED] citalopram (CELEXA) 40 MG tablet Take 1 tablet (40 mg total) by mouth daily. Please establish with a new provider for further refills  . [DISCONTINUED] cyclobenzaprine (FLEXERIL) 5 MG tablet TAKE 1 TABLET BY MOUTH EVERY NIGHT AT BEDTIME AS NEEDED   No facility-administered encounter medications on file as of 07/29/2019.     Allergies: Codeine  Body mass index is 22.1 kg/m.  Blood pressure 138/86, pulse 89, temperature 98.2 F (36.8 C), temperature source Oral, height 5\' 4"  (1.626 m), weight 128 lb 12 oz (58.4 kg), SpO2 100 %.  Review of Systems  Constitutional: Positive for fatigue. Negative for activity change,  appetite change, chills, diaphoresis, fever and unexpected weight change.  HENT: Negative for congestion.   Eyes: Negative for visual disturbance.  Respiratory: Negative for cough, chest tightness, shortness of breath, wheezing and stridor.   Cardiovascular: Negative for chest pain, palpitations and leg swelling.  Gastrointestinal: Negative for abdominal distention, blood in stool, constipation, diarrhea and nausea.  Genitourinary: Negative for difficulty urinating and flank pain.  Musculoskeletal: Positive for arthralgias and myalgias. Negative for back pain, gait problem, joint swelling, neck pain and neck stiffness.  Skin: Positive for rash.  Neurological: Negative for dizziness and headaches.  Hematological: Negative for adenopathy. Does not bruise/bleed easily.  Psychiatric/Behavioral: Negative for agitation, behavioral problems, confusion, decreased concentration, dysphoric mood, hallucinations, self-injury, sleep disturbance and suicidal ideas. The patient is not nervous/anxious and is not hyperactive.        Objective:   Physical Exam Vitals signs and nursing note reviewed.  Constitutional:      General: She is not in acute distress.    Appearance: She is normal weight. She is not ill-appearing, toxic-appearing or diaphoretic.  HENT:     Head: Normocephalic and atraumatic.  Eyes:     Extraocular Movements: Extraocular movements intact.     Conjunctiva/sclera: Conjunctivae normal.     Pupils: Pupils are equal, round, and reactive to light.  Cardiovascular:     Rate and Rhythm: Normal rate and regular rhythm.     Pulses: Normal pulses.     Heart sounds: Normal heart sounds. No murmur. No friction rub. No gallop.   Pulmonary:     Effort: Pulmonary effort is normal. No respiratory distress.     Breath sounds: Normal breath sounds. No stridor. No wheezing, rhonchi or rales.  Chest:     Chest wall: No tenderness.  Skin:    General: Skin is warm and dry.     Capillary Refill:  Capillary refill takes less than 2 seconds.  Neurological:     Mental Status: She is alert and oriented to person, place, and time.  Psychiatric:        Mood and Affect: Mood normal.        Behavior: Behavior normal.        Thought Content: Thought content normal.        Judgment: Judgment normal.       Assessment & Plan:   1. Healthcare maintenance   2. Need for immunization against influenza   3. ANXIETY DISORDER, GENERALIZED     Healthcare maintenance Continue your excellent hydration, fantastic level of activity, and healthy eating. Medication refills have been sent. Please schedule complete physical in 2 months, fasting labs the week prior. Continue to social distance and wear a mask when in public.  ANXIETY DISORDER, GENERALIZED She has been on Citalopram 40mg  QD for >10 years-reports stable mood, denies SI/HI She is not working with a therapist, declines  referral.    FOLLOW-UP:  Return in about 2 months (around 09/28/2019) for CPE, Fasting Labs.

## 2019-07-29 ENCOUNTER — Other Ambulatory Visit: Payer: Self-pay

## 2019-07-29 ENCOUNTER — Ambulatory Visit (INDEPENDENT_AMBULATORY_CARE_PROVIDER_SITE_OTHER): Payer: Medicare HMO | Admitting: Adult Health

## 2019-07-29 ENCOUNTER — Encounter: Payer: Self-pay | Admitting: Adult Health

## 2019-07-29 VITALS — BP 138/86 | HR 89 | Temp 98.2°F | Ht 64.0 in | Wt 128.8 lb

## 2019-07-29 DIAGNOSIS — Z23 Encounter for immunization: Secondary | ICD-10-CM | POA: Diagnosis not present

## 2019-07-29 DIAGNOSIS — R69 Illness, unspecified: Secondary | ICD-10-CM | POA: Diagnosis not present

## 2019-07-29 DIAGNOSIS — F411 Generalized anxiety disorder: Secondary | ICD-10-CM

## 2019-07-29 DIAGNOSIS — Z0000 Encounter for general adult medical examination without abnormal findings: Secondary | ICD-10-CM | POA: Diagnosis not present

## 2019-07-29 MED ORDER — CITALOPRAM HYDROBROMIDE 40 MG PO TABS: 40.0000 mg | ORAL_TABLET | Freq: Every day | ORAL | 1 refills | Status: DC

## 2019-07-29 MED ORDER — CYCLOBENZAPRINE HCL 5 MG PO TABS
5.0000 mg | ORAL_TABLET | Freq: Every evening | ORAL | 1 refills | Status: DC | PRN
Start: 2019-07-29 — End: 2020-03-07

## 2019-07-29 NOTE — Patient Instructions (Addendum)

## 2019-07-29 NOTE — Assessment & Plan Note (Signed)
Continue your excellent hydration, fantastic level of activity, and healthy eating. Medication refills have been sent. Please schedule complete physical in 2 months, fasting labs the week prior. Continue to social distance and wear a mask when in public.

## 2019-07-29 NOTE — Assessment & Plan Note (Signed)
She has been on Citalopram 40mg  QD for >10 years-reports stable mood, denies SI/HI She is not working with a therapist, declines referral.

## 2019-10-27 ENCOUNTER — Other Ambulatory Visit: Payer: Self-pay

## 2019-10-27 ENCOUNTER — Other Ambulatory Visit: Payer: Medicare HMO

## 2019-10-27 DIAGNOSIS — E785 Hyperlipidemia, unspecified: Secondary | ICD-10-CM | POA: Diagnosis not present

## 2019-10-27 DIAGNOSIS — Z0000 Encounter for general adult medical examination without abnormal findings: Secondary | ICD-10-CM | POA: Diagnosis not present

## 2019-10-27 DIAGNOSIS — D72829 Elevated white blood cell count, unspecified: Secondary | ICD-10-CM | POA: Diagnosis not present

## 2019-10-27 DIAGNOSIS — R7989 Other specified abnormal findings of blood chemistry: Secondary | ICD-10-CM | POA: Diagnosis not present

## 2019-10-28 LAB — CBC WITH DIFFERENTIAL/PLATELET
Basophils Absolute: 0 10*3/uL (ref 0.0–0.2)
Basos: 1 %
EOS (ABSOLUTE): 0.1 10*3/uL (ref 0.0–0.4)
Eos: 3 %
Hematocrit: 44 % (ref 34.0–46.6)
Hemoglobin: 14.7 g/dL (ref 11.1–15.9)
Immature Grans (Abs): 0 10*3/uL (ref 0.0–0.1)
Immature Granulocytes: 0 %
Lymphocytes Absolute: 1.5 10*3/uL (ref 0.7–3.1)
Lymphs: 42 %
MCH: 31.3 pg (ref 26.6–33.0)
MCHC: 33.4 g/dL (ref 31.5–35.7)
MCV: 94 fL (ref 79–97)
Monocytes Absolute: 0.3 10*3/uL (ref 0.1–0.9)
Monocytes: 10 %
Neutrophils Absolute: 1.6 10*3/uL (ref 1.4–7.0)
Neutrophils: 44 %
Platelets: 256 10*3/uL (ref 150–450)
RBC: 4.7 x10E6/uL (ref 3.77–5.28)
RDW: 13 % (ref 11.7–15.4)
WBC: 3.6 10*3/uL (ref 3.4–10.8)

## 2019-10-28 LAB — LIPID PANEL
Chol/HDL Ratio: 3.9 ratio (ref 0.0–4.4)
Cholesterol, Total: 283 mg/dL — ABNORMAL HIGH (ref 100–199)
HDL: 73 mg/dL (ref >39)
LDL Chol Calc (NIH): 186 mg/dL — ABNORMAL HIGH (ref 0–99)
Triglycerides: 133 mg/dL (ref 0–149)
VLDL Cholesterol Cal: 24 mg/dL (ref 5–40)

## 2019-10-28 LAB — COMPREHENSIVE METABOLIC PANEL
ALT: 16 IU/L (ref 0–32)
AST: 21 IU/L (ref 0–40)
Albumin/Globulin Ratio: 2 (ref 1.2–2.2)
Albumin: 4.4 g/dL (ref 3.8–4.8)
Alkaline Phosphatase: 73 IU/L (ref 39–117)
BUN/Creatinine Ratio: 18 (ref 12–28)
BUN: 16 mg/dL (ref 8–27)
Bilirubin Total: 0.4 mg/dL (ref 0.0–1.2)
CO2: 25 mmol/L (ref 20–29)
Calcium: 9.2 mg/dL (ref 8.7–10.3)
Chloride: 102 mmol/L (ref 96–106)
Creatinine, Ser: 0.87 mg/dL (ref 0.57–1.00)
GFR calc Af Amer: 78 mL/min/{1.73_m2} (ref >59)
GFR calc non Af Amer: 68 mL/min/{1.73_m2} (ref >59)
Globulin, Total: 2.2 g/dL (ref 1.5–4.5)
Glucose: 92 mg/dL (ref 65–99)
Potassium: 4.6 mmol/L (ref 3.5–5.2)
Sodium: 140 mmol/L (ref 134–144)
Total Protein: 6.6 g/dL (ref 6.0–8.5)

## 2019-10-28 LAB — HEMOGLOBIN A1C
Est. average glucose Bld gHb Est-mCnc: 108 mg/dL
Hgb A1c MFr Bld: 5.4 % (ref 4.8–5.6)

## 2019-10-28 LAB — TSH: TSH: 5.41 u[IU]/mL — ABNORMAL HIGH (ref 0.450–4.500)

## 2019-10-28 LAB — T4, FREE: Free T4: 1.03 ng/dL (ref 0.82–1.77)

## 2019-10-28 LAB — VITAMIN D 25 HYDROXY (VIT D DEFICIENCY, FRACTURES): Vit D, 25-Hydroxy: 36.2 ng/mL (ref 30.0–100.0)

## 2019-11-03 ENCOUNTER — Encounter: Payer: Self-pay | Admitting: Adult Health

## 2019-11-03 ENCOUNTER — Other Ambulatory Visit: Payer: Self-pay

## 2019-11-03 ENCOUNTER — Ambulatory Visit (INDEPENDENT_AMBULATORY_CARE_PROVIDER_SITE_OTHER): Payer: Medicare HMO | Admitting: Adult Health

## 2019-11-03 VITALS — BP 145/84 | HR 88 | Temp 99.2°F | Ht 63.5 in | Wt 132.6 lb

## 2019-11-03 DIAGNOSIS — Z0000 Encounter for general adult medical examination without abnormal findings: Secondary | ICD-10-CM | POA: Diagnosis not present

## 2019-11-03 DIAGNOSIS — Z23 Encounter for immunization: Secondary | ICD-10-CM

## 2019-11-03 MED ORDER — SHINGRIX 50 MCG/0.5ML IM SUSR: 0.5000 mL | Freq: Once | INTRAMUSCULAR | 0 refills | Status: AC

## 2019-11-03 NOTE — Patient Instructions (Signed)
  Kerri Taylor , Thank you for taking time to come for your Medicare Wellness Visit. I appreciate your ongoing commitment to your health goals. Please review the following plan we discussed and let me know if I can assist you in the future.   These are the goals we discussed: Continue all medications directed. Reduce saturated fat by at least 50%, continue regular walking. Please schedule fasting lab appt in 59months, re: re-check lipid panel. Please schedule COVID-19 vaccine ASAP. Hold off on Shingrix vaccine until COVID-19 vaccines are completed, plus 30 days. Continue to social distance and wear a mask when in public.  This is a list of the screening recommended for you and due dates:  Health Maintenance  Topic Date Due  .  Hepatitis C: One time screening is recommended by Center for Disease Control  (CDC) for  adults born from [birth year] through 1967.   1951-02-10  . Mammogram  06/27/2017  . Tetanus Vaccine  10/20/2022  . Colon Cancer Screening  05/04/2024  . Flu Shot  Completed  . DEXA scan (bone density measurement)  Completed  . Pneumonia vaccines  Completed

## 2019-11-03 NOTE — Progress Notes (Signed)
Subjective:   Kerri Taylor is a 69 y.o. female who presents for Medicare Annual (Subsequent) preventive examination.  Review of Systems: General:   Denies fever, chills, unexplained weight loss.  Optho/Auditory:   Denies visual changes, blurred vision/LOV Respiratory:   Denies SOB, DOE more than baseline levels.  Cardiovascular:   Denies chest pain, palpitations, new onset peripheral edema  Gastrointestinal:   Denies nausea, vomiting, diarrhea.  Genitourinary: Denies dysuria, freq/ urgency, flank pain or discharge from genitals.  Endocrine:     Denies hot or cold intolerance, polyuria, polydipsia. Musculoskeletal:   Denies unexplained myalgias, joint swelling, unexplained arthralgias, gait problems.  Skin:  Denies rash, suspicious lesions Neurological:     Denies dizziness, unexplained weakness, numbness  Psychiatric/Behavioral:   Denies mood changes, suicidal or homicidal ideations, hallucinations    Objective:     Vitals: BP (!) 145/84   Pulse 88   Temp 99.2 F (37.3 C) (Oral)   Ht 5' 3.5" (1.613 m)   Wt 132 lb 9.6 oz (60.1 kg)   SpO2 96% Comment: on RA  BMI 23.12 kg/m   Body mass index is 23.12 kg/m.  No flowsheet data found.  Tobacco Social History   Tobacco Use  Smoking Status Never Smoker  Smokeless Tobacco Never Used     Counseling given: Not Answered   Clinical Intake:  10/27/2019 Labs: CBC-stable CMP-stable A1c-5.4 Vit D- 36.2 TSH-5.410  Free T4-1.03 Lipid Panel Total-283 TGs-133 HDL-73 LDL-186 The 10-year ASCVD risk score Mikey Bussing DC Jr., et al., 2013) is: 12.4% Values used to calculate the score:  Age: 38 years  Sex: Female  Is Non-Hispanic African American: No  Diabetic: No  Tobacco smoker: No  Systolic Blood Pressure: 0000000 mmHg  Is BP treated: No  HDL Cholesterol: 73 mg/dL  Total Cholesterol: 283 mg/dL   2018 LDL- 185  Recommend mod dose statin- she declined, prefers lifestyle. Recommend re-checking  lipids in 4 months.  Past Medical History:  Diagnosis Date  . Anxiety   . Cancer ([place])   . Malignant melanoma of skin of right thigh [facility])    Past Surgical History:  Procedure Laterality Date  . ABDOMINAL HYSTERECTOMY  1990  . BREAST BIOPSY     cyst removed right breast/benign  . CYST REMOVAL NECK  1985   back of neck, benign  . PATELLA FRACTURE SURGERY  2013   right   Family History  Problem Relation Age of Onset  . Colon cancer Brother 46  . Aneurysm Mother   . Healthy Sister   . Suicidality Sister   . Healthy Brother   . Healthy Brother   . Healthy Brother   . Esophageal cancer Neg Hx   . Rectal cancer Neg Hx   . Stomach cancer Neg Hx    Social History   Socioeconomic History  . Marital status: Married    Spouse name: Not on file  . Number of children: Not on file  . Years of education: Not on file  . Highest education level: Not on file  Occupational History  . Not on file  Tobacco Use  . Smoking status: Never Smoker  . Smokeless tobacco: Never Used  Substance and Sexual Activity  . Alcohol use: Yes    Alcohol/week: 3.0 standard drinks    Types: 3 Cans of beer per week    Comment: occasional  . Drug use: No  . Sexual activity: Yes  Other Topics Concern  . Not on file  Social History Narrative  .  Not on file   Social Determinants of Health   Financial Resource Strain:   . Difficulty of Paying Living Expenses: Not on file  Food Insecurity:   . Worried About Charity fundraiser in the Last Year: Not on file  . Ran Out of Food in the Last Year: Not on file  Transportation Needs:   . Lack of Transportation (Medical): Not on file  . Lack of Transportation (Non-Medical): Not on file  Physical Activity:   . Days of Exercise per Week: Not on file  . Minutes of Exercise per Session: Not on file  Stress:   . Feeling of Stress : Not on file  Social Connections:   . Frequency of Communication with Friends and Family: Not on file  . Frequency of Social  Gatherings with Friends and Family: Not on file  . Attends Religious Services: Not on file  . Active Member of Clubs or Organizations: Not on file  . Attends Archivist Meetings: Not on file  . Marital Status: Not on file    Outpatient Encounter Medications as of 11/03/2019  Medication Sig  . cholecalciferol (VITAMIN D3) 25 MCG (1000 UT) tablet Take 1,000 Units by mouth daily.  . citalopram (CELEXA) 40 MG tablet Take 1 tablet (40 mg total) by mouth daily. Please establish with a new provider for further refills  . conjugated estrogens (PREMARIN) vaginal cream Place vaginally daily.  . cyclobenzaprine (FLEXERIL) 5 MG tablet Take 1 tablet (5 mg total) by mouth at bedtime as needed.  . triamcinolone (KENALOG) 0.025 % ointment Apply topically 2 (two) times daily.  [name] Kitchen Zoster Vaccine Adjuvanted [facility]) injection Inject 0.5 mLs into the muscle once for 1 dose.   No facility-administered encounter medications on file as of 11/03/2019.    Activities of Daily Living In your present state of health, do you have any difficulty performing the following activities: 11/03/2019 07/29/2019  Hearing? N N  Vision? N N  Difficulty concentrating or making decisions? N N  Walking or climbing stairs? N N  Dressing or bathing? N N  Doing errands, shopping? N N  Some recent data might be hidden    Patient Care Team: Esaw Grandchild, NP as PCP - General (Family Medicine) Sydnee Levans, MD as Referring Physician (Dermatology) Gatha Mayer, MD as Consulting Physician (Gastroenterology)    Assessment:   This is a routine wellness examination for Kerri Taylor.  Exercise Activities and Dietary recommendations She reports frequent intake of eggs, butter, cheese- encouraged 50% reduction in saturated fat. Walks >79miles most days of week  Fall Risk Fall Risk  11/03/2019 07/29/2019 01/17/2015  Falls in the past year? 0 0 No  Number falls in past yr: - 0 -  Follow up Falls evaluation completed Falls  evaluation completed -   Is the patient's home free of loose throw rugs in walkways, pet beds, electrical cords, etc?   yes      Grab bars in the bathroom? yes      Handrails on the stairs?   yes      Adequate lighting?   no  Timed Get Up and Go performed: 9 seconds  Depression Screen PHQ 2/9 Scores 11/03/2019 07/29/2019 12/26/2017 01/17/2015  PHQ - 2 Score 2 2 1  0  PHQ- 9 Score 2 3 1  -     Cognitive Function     6CIT Screen 11/03/2019  What Year? 0 points  What month? 0 points  What time? 0 points  Count back  from 20 0 points  Months in reverse 0 points  Repeat phrase 2 points  Total Score 2    Immunization History  Administered Date(s) Administered  . Fluad Quad(high Dose 65+) 07/29/2019  . Influenza, High Dose Seasonal PF 07/30/2017  . Pneumococcal Conjugate-13 01/17/2015  . Pneumococcal Polysaccharide-23 07/30/2017  . Td 10/01/2000  . Tdap 10/20/2012    Qualifies for Shingles Vaccine? Yes, RX sent to pharmacy  Screening Tests Health Maintenance  Topic Date Due  . Hepatitis C Screening  1950/11/18  . MAMMOGRAM  06/27/2017  . TETANUS/TDAP  10/20/2022  . COLONOSCOPY  05/04/2024  . INFLUENZA VACCINE  Completed  . DEXA SCAN  Completed  . PNA vac Low Risk Adult  Completed    Cancer Screenings: Lung: Low Dose CT Chest recommended if Age 58-80 years, 30 pack-year currently smoking OR have quit w/in 15years. Patient does not qualify. Breast:  Up to date on Mammogram? No   Up to date of Bone Density/Dexa? No Colorectal: done 05/05/2019, repeat in 5 years  Additional Screenings: : Hepatitis C Screening:  Pt declined     Plan:  Continue all medications directed. Reduce saturated fat by at least 50%, continue regular walking. Please schedule fasting lab appt in 8months, re: re-check lipid panel. Please schedule COVID-19 vaccine ASAP. Hold off on Shingrix vaccine until COVID-19 vaccines are completed, plus 30 days. Continue to social distance and wear a mask when in  public.  I have personally reviewed and noted the following in the patient's chart:   . Medical and social history . Use of alcohol, tobacco or illicit drugs  . Current medications and supplements . Functional ability and status . Nutritional status . Physical activity . Advanced directives . List of other physicians . Hospitalizations, surgeries, and ER visits in previous 12 months . Vitals . Screenings to include cognitive, depression, and falls . Referrals and appointments  In addition, I have reviewed and discussed with patient certain preventive protocols, quality metrics, and best practice recommendations. A written personalized care plan for preventive services as well as general preventive health recommendations were provided to patient.     Jennet Maduro, [place]  11/03/2019

## 2019-11-03 NOTE — Progress Notes (Deleted)
Subjective:    Patient ID: Kerri Taylor, female    DOB: 04/14/1951, 69 y.o.   MRN: [id number]  HPI:  MS. Kerri Taylor is here for CPE  10/28/2019 Labs: CBC-stable CMP-stable A1c-5.4 Vit D- 36.2 TSH-5.410  Free T4-1.03 Lipid Panel Total-283 TGs-133 HDL-73 LDL-186 The 10-year ASCVD risk score Mikey Bussing DC Jr., et al., 2013) is: 11.4% Values used to calculate the score:  Age: 69 years  Sex: Female  Is Non-Hispanic African American: No  Diabetic: No  Tobacco smoker: No  Systolic Blood Pressure: 0000000 mmHg  Is BP treated: No  HDL Cholesterol: 73 mg/dL  Total Cholesterol: 283 mg/dL   Healthcare Maintenance: PAP- Mammogram- Colonscopy- LDCT- Immunizations-  Patient Care Team    Relationship Specialty Notifications Start End  Mina Marble D, NP PCP - General Family Medicine  07/29/19   Sydnee Levans, MD Referring Physician Dermatology  07/29/19   Gatha Mayer, MD Consulting Physician Gastroenterology  07/29/19     Patient Active Problem List   Diagnosis Date Noted  . Healthcare maintenance 07/29/2019  . History of skin cancer 08/13/2018  . Routine general medical examination at a health care facility 07/30/2017  . Malignant melanoma of skin of right thigh ([place])   . Osteoarthritis 08/21/2012  . MELANOMA, STAGE II 07/20/2010  . SEBORRHEIC KERATOSIS, INFLAMED 07/17/2010  . LENTIGO 07/17/2010  . 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 ANXIETY DISORDER, GENERALIZED 12/01/2009  . VAGINITIS, ATROPHIC, POSTMENOPAUSAL 12/01/2009  . DYSHIDROSIS 12/01/2009     Past Medical History:  Diagnosis Date  . Anxiety   . Cancer ([place])   . Malignant melanoma of skin of right thigh [facility])      Past Surgical History:  Procedure Laterality Date  . ABDOMINAL HYSTERECTOMY  1990  . BREAST BIOPSY     cyst removed right breast/benign  . CYST REMOVAL NECK  1985   back of neck, benign  . PATELLA FRACTURE SURGERY  2013   right     Family History  Problem  Relation Age of Onset  . Colon cancer Brother 73  . Aneurysm Mother   . Healthy Sister   . Suicidality Sister   . Healthy Brother   . Healthy Brother   . Healthy Brother   . Esophageal cancer Neg Hx   . Rectal cancer Neg Hx   . Stomach cancer Neg Hx      Social History   Substance and Sexual Activity  Drug Use No     Social History   Substance and Sexual Activity  Alcohol Use Yes  . Alcohol/week: 3.0 standard drinks  . Types: 3 Cans of beer per week   Comment: occasional     Social History   Tobacco Use  Smoking Status Never Smoker  Smokeless Tobacco Never Used     Outpatient Encounter Medications as of 11/03/2019  Medication Sig  . cholecalciferol (VITAMIN D3) 25 MCG (1000 UT) tablet Take 1,000 Units by mouth daily.  . citalopram (CELEXA) 40 MG tablet Take 1 tablet (40 mg total) by mouth daily. Please establish with a new provider for further refills  . conjugated estrogens (PREMARIN) vaginal cream Place vaginally daily. (Patient not taking: Reported on 04/27/2019)  . cyclobenzaprine (FLEXERIL) 5 MG tablet Take 1 tablet (5 mg total) by mouth at bedtime as needed.  . triamcinolone (KENALOG) 0.025 % ointment Apply topically 2 (two) times daily. (Patient not taking: Reported on 04/27/2019)   No facility-administered encounter medications on file as of 11/03/2019.    Allergies:  Codeine  There is no height or weight on file to calculate BMI.  There were no vitals taken for this visit.     Review of Systems     Objective:   Physical Exam        Assessment & Plan:  No diagnosis found.  No problem-specific Assessment & Plan notes found for this encounter.    FOLLOW-UP:  No follow-ups on file.

## 2019-11-16 ENCOUNTER — Other Ambulatory Visit: Payer: Self-pay

## 2019-11-16 ENCOUNTER — Ambulatory Visit: Payer: Medicare HMO | Attending: Internal Medicine

## 2019-11-16 DIAGNOSIS — Z23 Encounter for immunization: Secondary | ICD-10-CM | POA: Insufficient documentation

## 2019-11-16 NOTE — Progress Notes (Signed)
   Covid-19 Vaccination Clinic  Name:  Kerri Taylor    MRN: [id number] DOB: Nov 28, 1950  11/16/2019  [name] was observed post Covid-19 immunization for 15 minutes without incidence. She was provided with Vaccine Information Sheet and instruction to access the V-Safe system.   [name] was instructed to call 911 with any severe reactions post vaccine: [name] Kitchen Difficulty breathing  . Swelling of your face and throat  . A fast heartbeat  . A bad rash all over your body  . Dizziness and weakness    Immunizations Administered    Name Date Dose VIS Date Route   Moderna COVID-19 Vaccine 11/16/2019  2:03 PM 0.5 mL 09/01/2019 Intramuscular   Manufacturer: Moderna   Lot: [id number]   [id number]

## 2019-12-15 ENCOUNTER — Ambulatory Visit: Payer: Medicare HMO | Attending: Internal Medicine

## 2019-12-15 DIAGNOSIS — Z23 Encounter for immunization: Secondary | ICD-10-CM

## 2019-12-15 NOTE — Progress Notes (Signed)
   Covid-19 Vaccination Clinic  Name:  KIMA DASINGER    MRN: [id number] DOB: November 06, 1950  12/15/2019  Ms. Maybury was observed post Covid-19 immunization for 15 minutes without incident. She was provided with Vaccine Information Sheet and instruction to access the V-Safe system.   Ms. Brungard was instructed to call 911 with any severe reactions post vaccine: [name] Kitchen Difficulty breathing  . Swelling of face and throat  . A fast heartbeat  . A bad rash all over body  . Dizziness and weakness   Immunizations Administered    Name Date Dose VIS Date Route   Moderna COVID-19 Vaccine 12/15/2019  2:17 PM 0.5 mL 09/01/2019 Intramuscular   Manufacturer: Moderna   Lot: [id number]   [id number]

## 2020-01-07 DIAGNOSIS — E785 Hyperlipidemia, unspecified: Secondary | ICD-10-CM | POA: Insufficient documentation

## 2020-01-08 ENCOUNTER — Other Ambulatory Visit: Payer: Self-pay | Admitting: Adult Health

## 2020-01-08 DIAGNOSIS — Z0000 Encounter for general adult medical examination without abnormal findings: Secondary | ICD-10-CM

## 2020-02-20 ENCOUNTER — Other Ambulatory Visit: Payer: Self-pay | Admitting: Adult Health

## 2020-03-07 ENCOUNTER — Other Ambulatory Visit: Payer: Self-pay | Admitting: Physician Assistant

## 2020-03-07 MED ORDER — CITALOPRAM HYDROBROMIDE 40 MG PO TABS: 40.0000 mg | ORAL_TABLET | Freq: Every day | ORAL | 0 refills | Status: DC

## 2020-03-07 NOTE — Telephone Encounter (Signed)
Patient called req'd refill on :   1)--- citalopram (CELEXA) 40 MG tablet [[id number]]   Order Details Dose: 40 mg Route: Oral Frequency: Daily  Dispense Quantity: 90 tablet Refills: 1       Sig: Take 1 tablet (40 mg total) by mouth daily. Please establish with a new provider for further refills   &   cyclobenzaprine (FLEXERIL) 5 MG tablet [[id number]]   Order Details Dose: 5 mg Route: Oral Frequency: At bedtime PRN  Dispense Quantity: 30 tablet Refills: 1   Indications of Use: Muscle Spasm      Sig: Take 1 tablet (5 mg total) by mouth at bedtime as needed.       Pt's AVS from 11/02/19 OV :   Return in about 4 months (around 03/02/2020) for Fasting Labs. (This has not been done--called patient to advise & scheduled the missed appt, waiting for her response.  -glh

## 2020-03-07 NOTE — Telephone Encounter (Signed)
Patient last seen 11/2019 by Valetta Fuller and advised to follow up in 4 months. Patient does not have an apt scheduled at this time.   Please approve if refill deemed appropriate. AS, CMA

## 2020-03-08 ENCOUNTER — Other Ambulatory Visit: Payer: Self-pay | Admitting: Adult Health

## 2020-03-08 MED ORDER — CYCLOBENZAPRINE HCL 5 MG PO TABS: 5.0000 mg | ORAL_TABLET | Freq: Every evening | ORAL | 0 refills | Status: DC | PRN

## 2020-03-16 DIAGNOSIS — R32 Unspecified urinary incontinence: Secondary | ICD-10-CM | POA: Diagnosis not present

## 2020-03-16 DIAGNOSIS — Z809 Family history of malignant neoplasm, unspecified: Secondary | ICD-10-CM | POA: Diagnosis not present

## 2020-03-16 DIAGNOSIS — Z85820 Personal history of malignant melanoma of skin: Secondary | ICD-10-CM | POA: Diagnosis not present

## 2020-03-16 DIAGNOSIS — R69 Illness, unspecified: Secondary | ICD-10-CM | POA: Diagnosis not present

## 2020-03-16 DIAGNOSIS — Z87891 Personal history of nicotine dependence: Secondary | ICD-10-CM | POA: Diagnosis not present

## 2020-03-22 ENCOUNTER — Other Ambulatory Visit: Payer: Self-pay

## 2020-03-22 ENCOUNTER — Other Ambulatory Visit: Payer: Medicare HMO

## 2020-03-22 DIAGNOSIS — E785 Hyperlipidemia, unspecified: Secondary | ICD-10-CM

## 2020-03-23 DIAGNOSIS — L720 Epidermal cyst: Secondary | ICD-10-CM | POA: Diagnosis not present

## 2020-03-23 DIAGNOSIS — L814 Other melanin hyperpigmentation: Secondary | ICD-10-CM | POA: Diagnosis not present

## 2020-03-23 DIAGNOSIS — D2271 Melanocytic nevi of right lower limb, including hip: Secondary | ICD-10-CM | POA: Diagnosis not present

## 2020-03-23 DIAGNOSIS — Z85820 Personal history of malignant melanoma of skin: Secondary | ICD-10-CM | POA: Diagnosis not present

## 2020-03-23 DIAGNOSIS — L918 Other hypertrophic disorders of the skin: Secondary | ICD-10-CM | POA: Diagnosis not present

## 2020-03-23 DIAGNOSIS — D225 Melanocytic nevi of trunk: Secondary | ICD-10-CM | POA: Diagnosis not present

## 2020-03-23 DIAGNOSIS — D2371 Other benign neoplasm of skin of right lower limb, including hip: Secondary | ICD-10-CM | POA: Diagnosis not present

## 2020-03-23 DIAGNOSIS — D2361 Other benign neoplasm of skin of right upper limb, including shoulder: Secondary | ICD-10-CM | POA: Diagnosis not present

## 2020-03-23 DIAGNOSIS — L821 Other seborrheic keratosis: Secondary | ICD-10-CM | POA: Diagnosis not present

## 2020-03-23 DIAGNOSIS — L570 Actinic keratosis: Secondary | ICD-10-CM | POA: Diagnosis not present

## 2020-03-23 LAB — LIPID PANEL
Chol/HDL Ratio: 3.3 ratio (ref 0.0–4.4)
Cholesterol, Total: 261 mg/dL — ABNORMAL HIGH (ref 100–199)
HDL: 78 mg/dL (ref >39)
LDL Chol Calc (NIH): 171 mg/dL — ABNORMAL HIGH (ref 0–99)
Triglycerides: 76 mg/dL (ref 0–149)
VLDL Cholesterol Cal: 12 mg/dL (ref 5–40)

## 2020-03-25 ENCOUNTER — Telehealth: Payer: Self-pay

## 2020-03-25 MED ORDER — ATORVASTATIN CALCIUM 10 MG PO TABS
10.0000 mg | ORAL_TABLET | Freq: Every day | ORAL | 0 refills | Status: DC
Start: 2020-03-25 — End: 2020-05-06

## 2020-03-25 NOTE — Telephone Encounter (Signed)
-----   Message from Lorrene Reid, [place] sent at 03/25/2020  9:07 AM EDT ----- Mindi Junker,  Please call Ms. Sponsel and notify of lipid panel results- total cholesterol and bad cholesterol have improved but remain elevated. Her 10 year ASCVD risk score is 13.5% and I recommend starting a statin. If patient agreeable, I will send in Lipitor 10 mg and she will need repeat lipid panel and hepatic function in 6 weeks. Otherwise, recommend to follow heart healthy diet and stay as active as possible. If she would like to discuss statin therapy further at her upcoming appointment it is reasonable.   The 10-year ASCVD risk score Mikey Bussing DC Brooke Bonito., et al., 2013) is: 13.5%   Values used to calculate the score:     Age: 11 years     Sex: Female     Is Non-Hispanic African American: No     Diabetic: No     Tobacco smoker: No     Systolic Blood Pressure: 163 mmHg     Is BP treated: No     HDL Cholesterol: 78 mg/dL     Total Cholesterol: 261 mg/dL  Thank you! [place]

## 2020-03-25 NOTE — Telephone Encounter (Signed)
03/25/2020  Pt informed of results.  Pt expressed understanding and is agreeable to starting Lipitor.  Rx sent to pharmacy per [facility] orders.  Charyl Bigger, CMA

## 2020-05-01 ENCOUNTER — Other Ambulatory Visit: Payer: Self-pay | Admitting: Physician Assistant

## 2020-05-02 ENCOUNTER — Other Ambulatory Visit: Payer: Self-pay | Admitting: Adult Health

## 2020-05-06 ENCOUNTER — Other Ambulatory Visit: Payer: Self-pay

## 2020-05-06 ENCOUNTER — Encounter: Payer: Self-pay | Admitting: Physician Assistant

## 2020-05-06 ENCOUNTER — Ambulatory Visit (INDEPENDENT_AMBULATORY_CARE_PROVIDER_SITE_OTHER): Payer: Medicare HMO | Admitting: Physician Assistant

## 2020-05-06 VITALS — BP 122/74 | HR 72 | Temp 97.7°F | Ht 63.5 in | Wt 133.1 lb

## 2020-05-06 DIAGNOSIS — R69 Illness, unspecified: Secondary | ICD-10-CM | POA: Diagnosis not present

## 2020-05-06 DIAGNOSIS — F411 Generalized anxiety disorder: Secondary | ICD-10-CM

## 2020-05-06 DIAGNOSIS — E785 Hyperlipidemia, unspecified: Secondary | ICD-10-CM | POA: Diagnosis not present

## 2020-05-06 MED ORDER — ATORVASTATIN CALCIUM 10 MG PO TABS: 10.0000 mg | ORAL_TABLET | Freq: Every day | ORAL | 1 refills | Status: DC

## 2020-05-06 MED ORDER — CITALOPRAM HYDROBROMIDE 40 MG PO TABS: ORAL_TABLET | ORAL | 1 refills | Status: DC

## 2020-05-06 NOTE — Progress Notes (Signed)
Established Patient Office Visit  Subjective:  Patient ID: Kerri Taylor, female    DOB: December 10, 1950  Age: 69 y.o. MRN: [id number]  CC:  Chief Complaint  Patient presents with   Depression    HPI Kerri Taylor presents for follow-up on mood management. Pt reports she is doing well and has no acute concerns today. Requesting refill of Celexa. Reports she has been on medication for a long time and continues to help with her anxiety.  HLD: Pt reports compliance with medication and tolerating well without side effects. Reports her diet is low on saturated fats and limits red meat. She stays active.    Past Medical History:  Diagnosis Date   Anxiety    Cancer ([place])    Malignant melanoma of skin of right thigh ([place])     Past Surgical History:  Procedure Laterality Date   ABDOMINAL HYSTERECTOMY  1990   BREAST BIOPSY     cyst removed right breast/benign   CYST REMOVAL NECK  1985   back of neck, benign   PATELLA FRACTURE SURGERY  2013   right    Family History  Problem Relation Age of Onset   Colon cancer Brother 83   Aneurysm Mother    Healthy Sister    Suicidality Sister    Healthy Brother    Healthy Brother    Healthy Brother    Esophageal cancer Neg Hx    Rectal cancer Neg Hx    Stomach cancer Neg Hx     Social History   Socioeconomic History   Marital status: Married    Spouse name: Not on file   Number of children: Not on file   Years of education: Not on file   Highest education level: Not on file  Occupational History   Not on file  Tobacco Use   Smoking status: Never Smoker   Smokeless tobacco: Never Used  Substance and Sexual Activity   Alcohol use: Yes    Alcohol/week: 3.0 standard drinks    Types: 3 Cans of beer per week    Comment: occasional   Drug use: No   Sexual activity: Yes  Other Topics Concern   Not on file  Social History Narrative   Not on file   Social Determinants of Health   Financial  Resource Strain:    Difficulty of Paying Living Expenses:   Food Insecurity:    Worried About Charity fundraiser in the Last Year:    Arboriculturist in the Last Year:   Transportation Needs:    Film/video editor (Medical):    Lack of Transportation (Non-Medical):   Physical Activity:    Days of Exercise per Week:    Minutes of Exercise per Session:   Stress:    Feeling of Stress :   Social Connections:    Frequency of Communication with Friends and Family:    Frequency of Social Gatherings with Friends and Family:    Attends Religious Services:    Active Member of Clubs or Organizations:    Attends Music therapist:    Marital Status:   Intimate Partner Violence:    Fear of Current or Ex-Partner:    Emotionally Abused:    Physically Abused:    Sexually Abused:     Outpatient Medications Prior to Visit  Medication Sig Dispense Refill   cholecalciferol (VITAMIN D3) 25 MCG (1000 UT) tablet Take 1,000 Units by mouth daily.     cyclobenzaprine (  FLEXERIL) 5 MG tablet Take 1 tablet (5 mg total) by mouth at bedtime as needed. 30 tablet 0   triamcinolone (KENALOG) 0.025 % ointment Apply topically 2 (two) times daily. 80 g 3   atorvastatin (LIPITOR) 10 MG tablet Take 1 tablet (10 mg total) by mouth daily. 90 tablet 0   citalopram (CELEXA) 40 MG tablet TAKE 1 TABLET BY MOUTH EVERY DAY NEED OFFICE VISIT/ESTABLISH PROVIDER 30 tablet 0   conjugated estrogens (PREMARIN) vaginal cream Place vaginally daily. (Patient not taking: Reported on 05/06/2020) 42.5 g 12   No facility-administered medications prior to visit.    Allergies  Allergen Reactions   Codeine Nausea And Vomiting    ROS Review of Systems Review of Systems:  A fourteen system review of systems was performed and found to be positive as per HPI.  Objective:    Physical Exam General: Well nourished, in no apparent distress. Eyes: PERRLA, EOMs, conjunctiva clr Resp: Respiratory  effort- normal, ECTA B/L w/o W/R/R  Cardio: RRR w/o MRGs. Abdomen: no gross distention. Lymphatics:  less 2 sec cap RF M-sk: Full ROM, 5/5 strength, normal gait.  Skin: Warm, dry  Neuro: Alert, Oriented Psych: Normal affect, Insight and Judgment appropriate.   BP 122/74    Pulse 72    Temp 97.7 F (36.5 C) (Oral)    Ht 5' 3.5" (1.613 m)    Wt 133 lb 1.6 oz (60.4 kg)    SpO2 98% Comment: on RA   BMI 23.21 kg/m  Wt Readings from Last 3 Encounters:  05/06/20 133 lb 1.6 oz (60.4 kg)  11/03/19 132 lb 9.6 oz (60.1 kg)  07/29/19 128 lb 12 oz (58.4 kg)     Health Maintenance Due  Topic Date Due   Hepatitis C Screening  Never done   MAMMOGRAM  06/27/2017   INFLUENZA VACCINE  05/01/2020    There are no preventive care reminders to display for this patient.  Lab Results  Component Value Date   TSH 5.410 (H) 10/27/2019   Lab Results  Component Value Date   WBC 3.6 10/27/2019   HGB 14.7 10/27/2019   HCT 44.0 10/27/2019   MCV 94 10/27/2019   PLT 256 10/27/2019   Lab Results  Component Value Date   NA 140 10/27/2019   K 4.6 10/27/2019   CO2 25 10/27/2019   GLUCOSE 92 10/27/2019   BUN 16 10/27/2019   CREATININE 0.87 10/27/2019   BILITOT 0.4 10/27/2019   ALKPHOS 73 10/27/2019   AST 21 10/27/2019   ALT 16 10/27/2019   PROT 6.6 10/27/2019   ALBUMIN 4.4 10/27/2019   CALCIUM 9.2 10/27/2019   GFR 91.25 07/30/2017   Lab Results  Component Value Date   CHOL 261 (H) 03/22/2020   Lab Results  Component Value Date   HDL 78 03/22/2020   Lab Results  Component Value Date   LDLCALC 171 (H) 03/22/2020   Lab Results  Component Value Date   TRIG 76 03/22/2020   Lab Results  Component Value Date   CHOLHDL 3.3 03/22/2020   Lab Results  Component Value Date   HGBA1C 5.4 10/27/2019      Assessment & Plan:   Problem List Items Addressed This Visit      Other   ANXIETY DISORDER, GENERALIZED - Primary   Relevant Medications   citalopram (CELEXA) 40 MG tablet    Hyperlipidemia   Relevant Medications   atorvastatin (LIPITOR) 10 MG tablet      GAD: -Stable -Continue current  medication regimen. Provided refill.  Hyperlipidemia:  -Last lipid panel improved but still elevated. -Continue Lipitor 10 mg. -Follow heart healthy diet.  Meds ordered this encounter  Medications   citalopram (CELEXA) 40 MG tablet    Sig: TAKE 1 TABLET BY MOUTH EVERY DAY    Dispense:  90 tablet    Refill:  1    Order Specific Question:   Supervising Provider    Answer:   Beatrice Lecher D [[id number]]   atorvastatin (LIPITOR) 10 MG tablet    Sig: Take 1 tablet (10 mg total) by mouth daily.    Dispense:  90 tablet    Refill:  1    Order Specific Question:   Supervising Provider    Answer:   Beatrice Lecher D [[id number]]    Follow-up: Return for MCW and FBW in 5 months.    Lorrene Reid, PA-C

## 2020-08-02 DIAGNOSIS — R69 Illness, unspecified: Secondary | ICD-10-CM | POA: Diagnosis not present

## 2020-08-11 ENCOUNTER — Other Ambulatory Visit: Payer: Self-pay | Admitting: Adult Health

## 2020-08-18 ENCOUNTER — Other Ambulatory Visit: Payer: Self-pay | Admitting: Adult Health

## 2020-10-04 ENCOUNTER — Ambulatory Visit: Payer: Medicare HMO | Admitting: Physician Assistant

## 2020-10-22 ENCOUNTER — Other Ambulatory Visit: Payer: Self-pay | Admitting: Physician Assistant

## 2020-10-22 DIAGNOSIS — F411 Generalized anxiety disorder: Secondary | ICD-10-CM

## 2020-11-14 ENCOUNTER — Ambulatory Visit: Payer: Medicare HMO | Admitting: Physician Assistant

## 2020-12-16 ENCOUNTER — Other Ambulatory Visit: Payer: Self-pay | Admitting: Physician Assistant

## 2020-12-16 ENCOUNTER — Telehealth: Payer: Self-pay | Admitting: Physician Assistant

## 2020-12-16 DIAGNOSIS — E785 Hyperlipidemia, unspecified: Secondary | ICD-10-CM

## 2020-12-16 NOTE — Telephone Encounter (Signed)
Please contact pt to schedule per last AVS for further medication refills. AS, CMA

## 2020-12-19 NOTE — Telephone Encounter (Signed)
Left voicemail for patient letting her know to call and schedule per last AVS for further medication refills.

## 2021-01-20 ENCOUNTER — Telehealth: Payer: Self-pay | Admitting: Physician Assistant

## 2021-01-20 ENCOUNTER — Other Ambulatory Visit: Payer: Self-pay | Admitting: Physician Assistant

## 2021-01-20 DIAGNOSIS — F411 Generalized anxiety disorder: Secondary | ICD-10-CM

## 2021-01-20 NOTE — Telephone Encounter (Signed)
Please contact patient to schedule per last AVS for further med refills. AS, CMA

## 2021-01-23 NOTE — Telephone Encounter (Signed)
Left voicemail for patient

## 2021-01-26 DIAGNOSIS — L814 Other melanin hyperpigmentation: Secondary | ICD-10-CM | POA: Diagnosis not present

## 2021-01-26 DIAGNOSIS — L820 Inflamed seborrheic keratosis: Secondary | ICD-10-CM | POA: Diagnosis not present

## 2021-01-26 DIAGNOSIS — D2272 Melanocytic nevi of left lower limb, including hip: Secondary | ICD-10-CM | POA: Diagnosis not present

## 2021-01-26 DIAGNOSIS — L821 Other seborrheic keratosis: Secondary | ICD-10-CM | POA: Diagnosis not present

## 2021-01-26 DIAGNOSIS — L438 Other lichen planus: Secondary | ICD-10-CM | POA: Diagnosis not present

## 2021-01-26 DIAGNOSIS — D2361 Other benign neoplasm of skin of right upper limb, including shoulder: Secondary | ICD-10-CM | POA: Diagnosis not present

## 2021-01-26 DIAGNOSIS — D225 Melanocytic nevi of trunk: Secondary | ICD-10-CM | POA: Diagnosis not present

## 2021-01-26 DIAGNOSIS — Z85820 Personal history of malignant melanoma of skin: Secondary | ICD-10-CM | POA: Diagnosis not present

## 2021-01-26 DIAGNOSIS — D2371 Other benign neoplasm of skin of right lower limb, including hip: Secondary | ICD-10-CM | POA: Diagnosis not present

## 2021-02-06 ENCOUNTER — Telehealth: Payer: Self-pay | Admitting: Physician Assistant

## 2021-02-06 ENCOUNTER — Other Ambulatory Visit: Payer: Self-pay | Admitting: Physician Assistant

## 2021-02-06 DIAGNOSIS — E785 Hyperlipidemia, unspecified: Secondary | ICD-10-CM

## 2021-02-06 NOTE — Telephone Encounter (Signed)
Please contact pt to schedule apt per last AVS for med refills. AS, CMA

## 2021-02-06 NOTE — Telephone Encounter (Signed)
Patient scheduled.

## 2021-02-22 ENCOUNTER — Telehealth: Payer: Self-pay | Admitting: Physician Assistant

## 2021-02-22 NOTE — Telephone Encounter (Signed)
Patient past due for mammogram. Left msg for patient to call office back to offer her apt on June 8th for when mammo bus will be at our office. AS, CMA

## 2021-02-28 ENCOUNTER — Other Ambulatory Visit: Payer: Self-pay

## 2021-02-28 ENCOUNTER — Ambulatory Visit (INDEPENDENT_AMBULATORY_CARE_PROVIDER_SITE_OTHER): Payer: Medicare HMO | Admitting: Physician Assistant

## 2021-02-28 ENCOUNTER — Encounter: Payer: Self-pay | Admitting: Physician Assistant

## 2021-02-28 VITALS — BP 119/71 | HR 73 | Temp 99.1°F | Ht 64.0 in | Wt 134.8 lb

## 2021-02-28 DIAGNOSIS — F411 Generalized anxiety disorder: Secondary | ICD-10-CM

## 2021-02-28 DIAGNOSIS — Z0000 Encounter for general adult medical examination without abnormal findings: Secondary | ICD-10-CM | POA: Diagnosis not present

## 2021-02-28 DIAGNOSIS — E785 Hyperlipidemia, unspecified: Secondary | ICD-10-CM | POA: Diagnosis not present

## 2021-02-28 DIAGNOSIS — G8929 Other chronic pain: Secondary | ICD-10-CM

## 2021-02-28 DIAGNOSIS — R69 Illness, unspecified: Secondary | ICD-10-CM | POA: Diagnosis not present

## 2021-02-28 DIAGNOSIS — M25519 Pain in unspecified shoulder: Secondary | ICD-10-CM

## 2021-02-28 MED ORDER — ATORVASTATIN CALCIUM 10 MG PO TABS: 10.0000 mg | ORAL_TABLET | Freq: Every day | ORAL | 1 refills | Status: DC

## 2021-02-28 MED ORDER — CITALOPRAM HYDROBROMIDE 40 MG PO TABS: ORAL_TABLET | ORAL | 1 refills | Status: DC

## 2021-02-28 MED ORDER — CYCLOBENZAPRINE HCL 5 MG PO TABS: 5.0000 mg | ORAL_TABLET | Freq: Every evening | ORAL | 1 refills | Status: DC | PRN

## 2021-02-28 NOTE — Patient Instructions (Signed)
Preventive Care 61 Years and Older, Female Preventive care refers to lifestyle choices and visits with your health care provider that can promote health and wellness. This includes:  A yearly physical exam. This is also called an annual wellness visit.  Regular dental and eye exams.  Immunizations.  Screening for certain conditions.  Healthy lifestyle choices, such as: ? Eating a healthy diet. ? Getting regular exercise. ? Not using drugs or products that contain nicotine and tobacco. ? Limiting alcohol use. What can I expect for my preventive care visit? Physical exam Your health care provider will check your:  Height and weight. These may be used to calculate your BMI (body mass index). BMI is a measurement that tells if you are at a healthy weight.  Heart rate and blood pressure.  Body temperature.  Skin for abnormal spots. Counseling Your health care provider may ask you questions about your:  Past medical problems.  Family's medical history.  Alcohol, tobacco, and drug use.  Emotional well-being.  Home life and relationship well-being.  Sexual activity.  Diet, exercise, and sleep habits.  History of falls.  Memory and ability to understand (cognition).  Work and work Statistician.  Pregnancy and menstrual history.  Access to firearms. What immunizations do I need? Vaccines are usually given at various ages, according to a schedule. Your health care provider will recommend vaccines for you based on your age, medical history, and lifestyle or other factors, such as travel or where you work.   What tests do I need? Blood tests  Lipid and cholesterol levels. These may be checked every 5 years, or more often depending on your overall health.  Hepatitis C test.  Hepatitis B test. Screening  Lung cancer screening. You may have this screening every year starting at age 72 if you have a 30-pack-year history of smoking and currently smoke or have quit within  the past 15 years.  Colorectal cancer screening. ? All adults should have this screening starting at age 44 and continuing until age 72. ? Your health care provider may recommend screening at age 2 if you are at increased risk. ? You will have tests every 1-10 years, depending on your results and the type of screening test.  Diabetes screening. ? This is done by checking your blood sugar (glucose) after you have not eaten for a while (fasting). ? You may have this done every 1-3 years.  Mammogram. ? This may be done every 1-2 years. ? Talk with your health care provider about how often you should have regular mammograms.  Abdominal aortic aneurysm (AAA) screening. You may need this if you are a current or former smoker.  BRCA-related cancer screening. This may be done if you have a family history of breast, ovarian, tubal, or peritoneal cancers. Other tests  STD (sexually transmitted disease) testing, if you are at risk.  Bone density scan. This is done to screen for osteoporosis. You may have this done starting at age 72. Talk with your health care provider about your test results, treatment options, and if necessary, the need for more tests. Follow these instructions at home: Eating and drinking  Eat a diet that includes fresh fruits and vegetables, whole grains, lean protein, and low-fat dairy products. Limit your intake of foods with high amounts of sugar, saturated fats, and salt.  Take vitamin and mineral supplements as recommended by your health care provider.  Do not drink alcohol if your health care provider tells you not to drink.  If you drink alcohol: ? Limit how much you have to 0-1 drink a day. ? Be aware of how much alcohol is in your drink. In the [place] one drink equals one 12 oz bottle of beer (355 mL), one 5 oz glass of wine (148 mL), or one 1 oz glass of hard liquor (44 mL).   Lifestyle  Take daily care of your teeth and gums. Brush your teeth every morning  and night with fluoride toothpaste. Floss one time each day.  Stay active. Exercise for at least 30 minutes 5 or more days each week.  Do not use any products that contain nicotine or tobacco, such as cigarettes, e-cigarettes, and chewing tobacco. If you need help quitting, ask your health care provider.  Do not use drugs.  If you are sexually active, practice safe sex. Use a condom or other form of protection in order to prevent STIs (sexually transmitted infections).  Talk with your health care provider about taking a low-dose aspirin or statin.  Find healthy ways to cope with stress, such as: ? Meditation, yoga, or listening to music. ? Journaling. ? Talking to a trusted person. ? Spending time with friends and family. Safety  Always wear your seat belt while driving or riding in a vehicle.  Do not drive: ? If you have been drinking alcohol. Do not ride with someone who has been drinking. ? When you are tired or distracted. ? While texting.  Wear a helmet and other protective equipment during sports activities.  If you have firearms in your house, make sure you follow all gun safety procedures. What's next?  Visit your health care provider once a year for an annual wellness visit.  Ask your health care provider how often you should have your eyes and teeth checked.  Stay up to date on all vaccines. This information is not intended to replace advice given to you by your health care provider. Make sure you discuss any questions you have with your health care provider. Document Revised: 09/07/2020 Document Reviewed: 09/11/2018 Elsevier Patient Education  2021 Elsevier Inc.  

## 2021-02-28 NOTE — Progress Notes (Signed)
Subjective:   Kerri Taylor is a 69 y.o. female who presents for Medicare Annual (Subsequent) preventive examination.  Review of Systems    General:   No F/C, wt loss Pulm:   No DIB, SOB, pleuritic chest pain Card:  No CP, palpitations Abd:  No n/v/d or pain Ext:  No inc edema from baseline    Objective:    Today's Vitals   02/28/21 1357  BP: 119/71  Pulse: 73  Temp: 99.1 F (37.3 C)  SpO2: 99%  Weight: 134 lb 12.8 oz (61.1 kg)  Height: 5\' 4"  (1.626 m)   Body mass index is 23.14 kg/m.  No flowsheet data found.  Current Medications (verified) Outpatient Encounter Medications as of 02/28/2021  Medication Sig  . cholecalciferol (VITAMIN D3) 25 MCG (1000 UT) tablet Take 1,000 Units by mouth daily.  [name] Kitchen conjugated estrogens (PREMARIN) vaginal cream Place vaginally daily.  [name] Kitchen triamcinolone (KENALOG) 0.025 % ointment Apply topically 2 (two) times daily.  . [DISCONTINUED] atorvastatin (LIPITOR) 10 MG tablet TAKE 1 TABLET (10 MG TOTAL) BY MOUTH DAILY. **NEEDS APT FOR REFILLS**  . [DISCONTINUED] citalopram (CELEXA) 40 MG tablet TAKE 1 TABLET BY MOUTH EVERY DAY**NEEDS APT FOR REFILLS**  . [DISCONTINUED] cyclobenzaprine (FLEXERIL) 5 MG tablet Take 1 tablet (5 mg total) by mouth at bedtime as needed.  [name] Kitchen atorvastatin (LIPITOR) 10 MG tablet Take 1 tablet (10 mg total) by mouth daily.  . citalopram (CELEXA) 40 MG tablet TAKE 1 TABLET BY MOUTH EVERY DAY  . cyclobenzaprine (FLEXERIL) 5 MG tablet Take 1 tablet (5 mg total) by mouth at bedtime as needed.   No facility-administered encounter medications on file as of 02/28/2021.    Allergies (verified) Codeine   History: Past Medical History:  Diagnosis Date  . Anxiety   . Cancer ([place])   . 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 ABDOMINAL HYSTERECTOMY  1990  . BREAST BIOPSY     cyst removed right breast/benign  . CYST REMOVAL NECK  1985   back of neck, benign  .  PATELLA FRACTURE SURGERY  2013   right   Family History  Problem Relation Age of Onset  . Colon cancer Brother 35  . Aneurysm Mother   . Healthy Sister   . Suicidality Sister   . Healthy Brother   . Healthy Brother   . Healthy Brother   . Esophageal cancer Neg Hx   . Rectal cancer Neg Hx   . Stomach cancer Neg Hx    Social History   Socioeconomic History  . Marital status: Married    Spouse name: Not on file  . Number of children: Not on file  . Years of education: Not on file  . Highest education level: Not on file  Occupational History  . Not on file  Tobacco Use  . Smoking status: Never Smoker  . Smokeless tobacco: Never Used  Substance and Sexual Activity  . Alcohol use: Yes    Alcohol/week: 3.0 standard drinks    Types: 3 Cans of beer per week    Comment: occasional  . Drug use: No  . Sexual activity: Yes  Other Topics Concern  . Not on file  Social History Narrative  . Not on file   Social Determinants of Health   Financial Resource Strain: Not on file  Food Insecurity: Not on file  Transportation Needs: Not on file  Physical Activity: Not on  file  Stress: Not on file  Social Connections: Not on file    Tobacco Counseling Counseling given: Not Answered    Diabetic? No     Activities of Daily Living In your present state of health, do you have any difficulty performing the following activities: 02/28/2021 05/06/2020  Hearing? N N  Vision? Y Y  Difficulty concentrating or making decisions? N N  Walking or climbing stairs? N N  Dressing or bathing? N N  Doing errands, shopping? N N  Some recent data might be hidden    Patient Care Team: Lorrene Reid, PA-C as PCP - General (Physician Assistant) Sydnee Levans, MD as Referring Physician (Dermatology) Gatha Mayer, MD as Consulting Physician (Gastroenterology)  Indicate any recent Medical Services you may have received from other than [facility] providers in the past year (date may be  approximate).     Assessment:   This is a routine wellness examination for Kerri Taylor.  Hearing/Vision screen No exam data present  Dietary issues and exercise activities discussed: -Continue with low fat and heart healthy diet. Continue to stay active with walking, gardening and house work. Stay well hydrated.  Goals Addressed   None    Depression Screen PHQ 2/9 Scores 02/28/2021 05/06/2020 11/03/2019 07/29/2019 12/26/2017 01/17/2015  PHQ - 2 Score 0 0 2 2 1  0  PHQ- 9 Score 0 0 2 3 1  -    Fall Risk Fall Risk  02/28/2021 05/06/2020 11/03/2019 07/29/2019 01/17/2015  Falls in the past year? 0 0 0 0 No  Number falls in past yr: 0 - - 0 -  Injury with Fall? 0 - - - -  Risk for fall due to : No Fall Risks No Fall Risks - - -  Follow up Falls evaluation completed Falls evaluation completed Falls evaluation completed Falls evaluation completed -    FALL RISK PREVENTION PERTAINING TO THE HOME:  Any stairs in or around the home? Yes  If so, are there any without handrails? No  Home free of loose throw rugs in walkways, pet beds, electrical cords, etc? No  Adequate lighting in your home to reduce risk of falls? Yes   ASSISTIVE DEVICES UTILIZED TO PREVENT FALLS:  Life alert? No  Use of a cane, walker or w/c? No  Grab bars in the bathroom? Yes  Shower chair or bench in shower? No  Elevated toilet seat or a handicapped toilet? Yes   TIMED UP AND GO:  Was the test performed? Yes .  Length of time to ambulate 10 feet: 10 sec.   Gait steady and fast without use of assistive device  Cognitive Function: wnl's     6CIT Screen 02/28/2021 11/03/2019  What Year? 0 points 0 points  What month? 0 points 0 points  What time? 0 points 0 points  Count back from 20 0 points 0 points  Months in reverse 0 points 0 points  Repeat phrase 0 points 2 points  Total Score 0 2    Immunizations Immunization History  Administered Date(s) Administered  . Fluad Quad(high Dose 65+) 07/29/2019  . Influenza,  High Dose Seasonal PF 07/30/2017  . Influenza-Unspecified 08/02/2020  . Moderna Sars-Covid-2 Vaccination 11/16/2019, 12/15/2019  . Pneumococcal Conjugate-13 01/17/2015  . Pneumococcal Polysaccharide-23 07/30/2017  . Td 10/01/2000  . Tdap 10/20/2012    TDAP status: Up to date  Flu Vaccine status: Up to date  Pneumococcal vaccine status: Up to date  Covid-19 vaccine status: Completed vaccines  Qualifies for Shingles Vaccine? Yes  Zostavax completed No   Shingrix Completed?: No.    Education has been provided regarding the importance of this vaccine. Patient has been advised to call insurance company to determine out of pocket expense if they have not yet received this vaccine. Advised may also receive vaccine at local pharmacy or Health Dept. Verbalized acceptance and understanding.  Screening Tests Health Maintenance  Topic Date Due  . Hepatitis C Screening  Never done  . Zoster Vaccines- Shingrix (1 of 2) Never done  . MAMMOGRAM  06/27/2017  . COVID-19 Vaccine (3 - Moderna risk 4-dose series) 01/12/2020  . INFLUENZA VACCINE  05/01/2021  . TETANUS/TDAP  10/20/2022  . COLONOSCOPY (Pts 45-60yrs Insurance coverage will need to be confirmed)  05/04/2024  . DEXA SCAN  Completed  . PNA vac Low Risk Adult  Completed  . HPV VACCINES  Aged Out    Health Maintenance  Health Maintenance Due  Topic Date Due  . Hepatitis C Screening  Never done  . Zoster Vaccines- Shingrix (1 of 2) Never done  . MAMMOGRAM  06/27/2017  . COVID-19 Vaccine (3 - Moderna risk 4-dose series) 01/12/2020    Colorectal cancer screening: Type of screening: Colonoscopy. Completed 05/05/2019. Repeat every 5 years  Mammogram status: Ordered (patient added to list for mobile mammogram). Pt provided with contact info and advised to call to schedule appt.   Bone Density status: Completed 01/24/2015. Results reflect: Bone density results: OSTEOPOROSIS. Repeat every 2 years. pt declined new order  Lung Cancer  Screening: (Low Dose CT Chest recommended if Age 49-80 years, 30 pack-year currently smoking OR have quit w/in 15years.) does not qualify.   Lung Cancer Screening Referral: n/a  Additional Screening:  Hepatitis C Screening: does qualify; Completed pt declined  Vision Screening: Recommended annual ophthalmology exams for early detection of glaucoma and other disorders of the eye. Is the patient up to date with their annual eye exam?  Yes  Who is the provider or what is the name of the office in which the patient attends annual eye exams?  If pt is not established with a provider, would they like to be referred to a provider to establish care? No .   Dental Screening: Recommended annual dental exams for proper oral hygiene  Community Resource Referral / Chronic Care Management: CRR required this visit?  No   CCM required this visit?  No      Plan:  -Schedule lab visit for FBW. -Continue current medication regimen. Provided refills. -Follow up in 6 months for HTN, HLD  I have personally reviewed and noted the following in the patient's chart:   . Medical and social history . Use of alcohol, tobacco or illicit drugs  . Current medications and supplements including opioid prescriptions.  . Functional ability and status . Nutritional status . Physical activity . Advanced directives . List of other physicians . Hospitalizations, surgeries, and ER visits in previous 12 months . Vitals . Screenings to include cognitive, depression, and falls . Referrals and appointments  In addition, I have reviewed and discussed with patient certain preventive protocols, quality metrics, and best practice recommendations. A written personalized care plan for preventive services as well as general preventive health recommendations were provided to patient.     Lorrene Reid, PA-C   02/28/2021

## 2021-03-03 ENCOUNTER — Other Ambulatory Visit: Payer: Self-pay | Admitting: Physician Assistant

## 2021-03-03 DIAGNOSIS — Z139 Encounter for screening, unspecified: Secondary | ICD-10-CM

## 2021-03-08 ENCOUNTER — Ambulatory Visit
Admission: RE | Admit: 2021-03-08 | Discharge: 2021-03-08 | Disposition: A | Discharge status: Home / Self Care | Payer: Medicare HMO | Source: Ambulatory Visit | Attending: Physician Assistant | Admitting: Physician Assistant

## 2021-03-08 ENCOUNTER — Other Ambulatory Visit: Payer: Self-pay

## 2021-03-08 DIAGNOSIS — Z139 Encounter for screening, unspecified: Secondary | ICD-10-CM

## 2021-03-08 DIAGNOSIS — Z1231 Encounter for screening mammogram for malignant neoplasm of breast: Secondary | ICD-10-CM | POA: Diagnosis not present

## 2021-03-10 ENCOUNTER — Other Ambulatory Visit: Payer: Self-pay | Admitting: Physician Assistant

## 2021-03-10 DIAGNOSIS — E785 Hyperlipidemia, unspecified: Secondary | ICD-10-CM

## 2021-03-10 DIAGNOSIS — Z0000 Encounter for general adult medical examination without abnormal findings: Secondary | ICD-10-CM

## 2021-03-13 ENCOUNTER — Other Ambulatory Visit: Payer: Medicare HMO

## 2021-03-17 ENCOUNTER — Other Ambulatory Visit: Payer: Self-pay | Admitting: Physician Assistant

## 2021-03-17 DIAGNOSIS — R928 Other abnormal and inconclusive findings on diagnostic imaging of breast: Secondary | ICD-10-CM

## 2021-04-13 ENCOUNTER — Ambulatory Visit: Admission: RE | Admit: 2021-04-13 | Payer: Medicare HMO | Source: Ambulatory Visit

## 2021-04-13 ENCOUNTER — Other Ambulatory Visit: Payer: Self-pay

## 2021-04-13 ENCOUNTER — Other Ambulatory Visit: Payer: Self-pay | Admitting: Physician Assistant

## 2021-04-13 ENCOUNTER — Ambulatory Visit
Admission: RE | Admit: 2021-04-13 | Discharge: 2021-04-13 | Disposition: A | Discharge status: Home / Self Care | Payer: Medicare HMO | Source: Ambulatory Visit | Attending: Physician Assistant | Admitting: Physician Assistant

## 2021-04-13 DIAGNOSIS — R928 Other abnormal and inconclusive findings on diagnostic imaging of breast: Secondary | ICD-10-CM

## 2021-04-13 DIAGNOSIS — R922 Inconclusive mammogram: Secondary | ICD-10-CM | POA: Diagnosis not present

## 2021-08-17 DIAGNOSIS — H5203 Hypermetropia, bilateral: Secondary | ICD-10-CM | POA: Diagnosis not present

## 2021-08-27 ENCOUNTER — Other Ambulatory Visit: Payer: Self-pay | Admitting: Physician Assistant

## 2021-08-27 DIAGNOSIS — E785 Hyperlipidemia, unspecified: Secondary | ICD-10-CM

## 2021-08-27 DIAGNOSIS — F411 Generalized anxiety disorder: Secondary | ICD-10-CM

## 2021-09-04 ENCOUNTER — Ambulatory Visit: Payer: Medicare HMO | Admitting: Physician Assistant

## 2021-10-06 ENCOUNTER — Encounter: Payer: Self-pay | Admitting: Physician Assistant

## 2021-10-06 ENCOUNTER — Other Ambulatory Visit: Payer: Self-pay

## 2021-10-06 ENCOUNTER — Ambulatory Visit (INDEPENDENT_AMBULATORY_CARE_PROVIDER_SITE_OTHER): Payer: Medicare Other | Admitting: Physician Assistant

## 2021-10-06 VITALS — BP 146/79 | HR 80 | Temp 98.2°F | Ht 64.0 in | Wt 134.0 lb

## 2021-10-06 DIAGNOSIS — R030 Elevated blood-pressure reading, without diagnosis of hypertension: Secondary | ICD-10-CM | POA: Diagnosis not present

## 2021-10-06 DIAGNOSIS — H6123 Impacted cerumen, bilateral: Secondary | ICD-10-CM | POA: Diagnosis not present

## 2021-10-06 NOTE — Progress Notes (Signed)
Acute Office Visit  Subjective:    Patient ID: Kerri Taylor, female    DOB: 1951-07-26, 71 y.o.   MRN: [id number]  Chief Complaint  Patient presents with   Acute Visit    HPI Patient is in today for c/o bilateral ear fullness. Denies earache, fever, nasal congestion, or otorrhea. Reports does have itchy dry skin and applies cream that is prescribed by her dermatologist.  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 ([place])     Past Surgical History:  Procedure Laterality Date   ABDOMINAL HYSTERECTOMY  1990   BREAST BIOPSY     cyst removed right breast/benign   BREAST CYST EXCISION Right    CYST REMOVAL NECK  1985   back of neck, benign   PATELLA FRACTURE SURGERY  2013   right    Family History  Problem Relation Age of Onset   Colon cancer Brother 39   Aneurysm Mother    Healthy Sister    Suicidality Sister    Healthy Brother    Healthy Brother    Healthy Brother    Esophageal cancer Neg Hx    Rectal cancer Neg Hx    Stomach cancer Neg Hx     Social History   Socioeconomic History   Marital status: Married    Spouse name: Not on file   Number of children: Not on file   Years of education: Not on file   Highest education level: Not on file  Occupational History   Not on file  Tobacco Use   Smoking status: Never   Smokeless tobacco: Never  Substance and Sexual Activity   Alcohol use: Yes    Alcohol/week: 3.0 standard drinks    Types: 3 Cans of beer per week    Comment: occasional   Drug use: No   Sexual activity: Yes  Other Topics Concern   Not on file  Social History Narrative   Not on file   Social Determinants of Health   Financial Resource Strain: Not on file  Food Insecurity: Not on file  Transportation Needs: Not on file  Physical Activity: Not on file  Stress: Not on file  Social Connections: Not on file  Intimate Partner Violence: Not on file    Outpatient  Medications Prior to Visit  Medication Sig Dispense Refill   atorvastatin (LIPITOR) 10 MG tablet TAKE 1 TABLET BY MOUTH EVERY DAY 90 tablet 1   cholecalciferol (VITAMIN D3) 25 MCG (1000 UT) tablet Take 1,000 Units by mouth daily.     citalopram (CELEXA) 40 MG tablet TAKE 1 TABLET BY MOUTH EVERY DAY 90 tablet 1   conjugated estrogens (PREMARIN) vaginal cream Place vaginally daily. 42.5 g 12   cyclobenzaprine (FLEXERIL) 5 MG tablet Take 1 tablet (5 mg total) by mouth at bedtime as needed. 30 tablet 1   triamcinolone (KENALOG) 0.025 % ointment Apply topically 2 (two) times daily. 80 g 3   No facility-administered medications prior to visit.    Allergies  Allergen Reactions   Codeine Nausea And Vomiting    Review of Systems Review of Systems:  A fourteen system review of systems was performed and found to be positive as per HPI.    Objective:    Physical Exam General:  Pleasant and cooperative, in no acute distress   Neuro:  Alert and oriented,  extra-ocular muscles intact  HEENT:  Normocephalic, atraumatic,  no sinus tenderness, bilateral cerumen impaction, neck supple Skin:  no gross rash, warm, pink. Cardiac:  RRR Respiratory:  Not using accessory muscles, speaking in full sentences- unlabored. Vascular:  Ext warm, no cyanosis apprec.; cap RF less 2 sec. Psych:  No HI/SI, judgement and insight good, Euthymic mood. Full Affect.  BP (!) 146/79    Pulse 80    Temp 98.2 F (36.8 C)    Ht 5\' 4"  (1.626 m)    Wt 134 lb (60.8 kg)    SpO2 95%    BMI 23.00 kg/m  Wt Readings from Last 3 Encounters:  10/06/21 134 lb (60.8 kg)  02/28/21 134 lb 12.8 oz (61.1 kg)  05/06/20 133 lb 1.6 oz (60.4 kg)    Health Maintenance Due  Topic Date Due   Hepatitis C Screening  Never done   Zoster Vaccines- Shingrix (1 of 2) Never done   COVID-19 Vaccine (3 - Moderna risk series) 01/12/2020   INFLUENZA VACCINE  05/01/2021    There are no preventive care reminders to display for this  patient.   Lab Results  Component Value Date   TSH 5.410 (H) 10/27/2019   Lab Results  Component Value Date   WBC 3.6 10/27/2019   HGB 14.7 10/27/2019   HCT 44.0 10/27/2019   MCV 94 10/27/2019   PLT 256 10/27/2019   Lab Results  Component Value Date   NA 140 10/27/2019   K 4.6 10/27/2019   CO2 25 10/27/2019   GLUCOSE 92 10/27/2019   BUN 16 10/27/2019   CREATININE 0.87 10/27/2019   BILITOT 0.4 10/27/2019   ALKPHOS 73 10/27/2019   AST 21 10/27/2019   ALT 16 10/27/2019   PROT 6.6 10/27/2019   ALBUMIN 4.4 10/27/2019   CALCIUM 9.2 10/27/2019   GFR 91.25 07/30/2017   Lab Results  Component Value Date   CHOL 261 (H) 03/22/2020   Lab Results  Component Value Date   HDL 78 03/22/2020   Lab Results  Component Value Date   LDLCALC 171 (H) 03/22/2020   Lab Results  Component Value Date   TRIG 76 03/22/2020   Lab Results  Component Value Date   CHOLHDL 3.3 03/22/2020   Lab Results  Component Value Date   HGBA1C 5.4 10/27/2019       Assessment & Plan:   Problem List Items Addressed This Visit   None Visit Diagnoses     Bilateral impacted cerumen    -  Primary   Elevated blood pressure reading          Bilateral impacted cerumen: -Indication: Cerumen impaction of both ears Medical necessity statement:  On physical examination, cerumen impairs clinically significant portions of the external auditory canal, and tympanic membrane.  Noted obstructive, copious cerumen. Consent:  Discussed benefits and risks of procedure and verbal consent obtained Procedure:   Patient was prepped for the procedure.  Utilized an otoscope to assess and take note of the ear canal, the tympanic membrane, and the presence, amount, and placement of the cerumen. Gentle water irrigation and soft plastic curette was utilized to remove cerumen. Post procedure examination:  shows cerumen was removed, without trauma or injury to the ear canal or TM, which remains intact.   Post-Procedural  Ear Care Instructions:    Patient tolerated procedure well.  Proper ear care d/c pt.   The patient is made aware that they may experience temporary vertigo, temporary hearing loss, and temporary discomfort.  If these symptom last for more than  24 hours to call the clinic or proceed to the ED/Urgent Care.   Elevated blood pressure reading: -BP elevated, pt left before BP repeated, will reassess BP with physical.   No orders of the defined types were placed in this encounter.    Lorrene Reid, PA-C

## 2021-10-06 NOTE — Patient Instructions (Addendum)
[facility]  Earwax Buildup, Adult The ears produce a substance called earwax that helps keep bacteria out of the ear and protects the skin in the ear canal. Occasionally, earwax can build up in the ear and cause discomfort or hearing loss. What are the causes? This condition is caused by a buildup of earwax. Ear canals are self-cleaning. Ear wax is made in the outer part of the ear canal and generally falls out in small amounts over time. When the self-cleaning mechanism is not working, earwax builds up and can cause decreased hearing and discomfort. Attempting to clean ears with cotton swabs can push the earwax deep into the ear canal and cause decreased hearing and pain. What increases the risk? This condition is more likely to develop in people who: Clean their ears often with cotton swabs. Pick at their ears. Use earplugs or in-ear headphones often, or wear hearing aids. The following factors may also make you more likely to develop this condition: Being female. Being of older age. Naturally producing more earwax. Having narrow ear canals. Having earwax that is overly thick or sticky. Having excess hair in the ear canal. Having eczema. Being dehydrated. What are the signs or symptoms? Symptoms of this condition include: Reduced or muffled hearing. A feeling of fullness in the ear or feeling that the ear is plugged. Fluid coming from the ear. Ear pain or an itchy ear. Ringing in the ear. Coughing. Balance problems. An obvious piece of earwax that can be seen inside the ear canal. How is this diagnosed? This condition may be diagnosed based on: Your symptoms. Your medical history. An ear exam. During the exam, your health care provider will look into your ear with an instrument called an otoscope. You may have tests, including a hearing test. How is this treated? This condition may be treated by: Using ear drops to soften the earwax. Having the earwax removed by a health care  provider. The health care provider may: Flush the ear with water. Use an instrument that has a loop on the end (curette). Use a suction device. Having surgery to remove the wax buildup. This may be done in severe cases. Follow these instructions at home:  Take over-the-counter and prescription medicines only as told by your health care provider. Do not put any objects, including cotton swabs, into your ear. You can clean the opening of your ear canal with a washcloth or facial tissue. Follow instructions from your health care provider about cleaning your ears. Do not overclean your ears. Drink enough fluid to keep your urine pale yellow. This will help to thin the earwax. Keep all follow-up visits as told. If earwax builds up in your ears often or if you use hearing aids, consider seeing your health care provider for routine, preventive ear cleanings. Ask your health care provider how often you should schedule your cleanings. If you have hearing aids, clean them according to instructions from the manufacturer and your health care provider. Contact a health care provider if: You have ear pain. You develop a fever. You have pus or other fluid coming from your ear. You have hearing loss. You have ringing in your ears that does not go away. You feel like the room is spinning (vertigo). Your symptoms do not improve with treatment. Get help right away if: You have bleeding from the affected ear. You have severe ear pain. Summary Earwax can build up in the ear and cause discomfort or hearing loss. The most common symptoms of this  condition include reduced or muffled hearing, a feeling of fullness in the ear, or feeling that the ear is plugged. This condition may be diagnosed based on your symptoms, your medical history, and an ear exam. This condition may be treated by using ear drops to soften the earwax or by having the earwax removed by a health care provider. Do not put any objects,  including cotton swabs, into your ear. You can clean the opening of your ear canal with a washcloth or facial tissue. This information is not intended to replace advice given to you by your health care provider. Make sure you discuss any questions you have with your health care provider. Document Revised: 01/05/2020 Document Reviewed: 01/05/2020 Elsevier Patient Education  [place].

## 2021-10-17 ENCOUNTER — Encounter: Payer: Self-pay | Admitting: Physician Assistant

## 2021-10-17 ENCOUNTER — Ambulatory Visit (INDEPENDENT_AMBULATORY_CARE_PROVIDER_SITE_OTHER): Payer: Medicare Other | Admitting: Physician Assistant

## 2021-10-17 ENCOUNTER — Other Ambulatory Visit: Payer: Self-pay

## 2021-10-17 VITALS — BP 111/75 | HR 90 | Temp 98.1°F | Ht 64.0 in | Wt 132.0 lb

## 2021-10-17 DIAGNOSIS — Z0000 Encounter for general adult medical examination without abnormal findings: Secondary | ICD-10-CM

## 2021-10-17 DIAGNOSIS — E559 Vitamin D deficiency, unspecified: Secondary | ICD-10-CM

## 2021-10-17 DIAGNOSIS — E785 Hyperlipidemia, unspecified: Secondary | ICD-10-CM

## 2021-10-17 DIAGNOSIS — Z1321 Encounter for screening for nutritional disorder: Secondary | ICD-10-CM

## 2021-10-17 DIAGNOSIS — M25519 Pain in unspecified shoulder: Secondary | ICD-10-CM

## 2021-10-17 DIAGNOSIS — Z1329 Encounter for screening for other suspected endocrine disorder: Secondary | ICD-10-CM

## 2021-10-17 DIAGNOSIS — G8929 Other chronic pain: Secondary | ICD-10-CM

## 2021-10-17 DIAGNOSIS — Z130 Encounter for screening for diseases of the blood and blood-forming organs and certain disorders involving the immune mechanism: Secondary | ICD-10-CM

## 2021-10-17 DIAGNOSIS — Z13228 Encounter for screening for other metabolic disorders: Secondary | ICD-10-CM

## 2021-10-17 MED ORDER — CYCLOBENZAPRINE HCL 5 MG PO TABS: 5.0000 mg | ORAL_TABLET | Freq: Every evening | ORAL | 1 refills | Status: DC | PRN

## 2021-10-17 NOTE — Patient Instructions (Signed)
Preventive Care 65 Years and Older, Female °Preventive care refers to lifestyle choices and visits with your health care provider that can promote health and wellness. Preventive care visits are also called wellness exams. °What can I expect for my preventive care visit? °Counseling °Your health care provider may ask you questions about your: °Medical history, including: °Past medical problems. °Family medical history. °Pregnancy and menstrual history. °History of falls. °Current health, including: °Memory and ability to understand (cognition). °Emotional well-being. °Home life and relationship well-being. °Sexual activity and sexual health. °Lifestyle, including: °Alcohol, nicotine or tobacco, and drug use. °Access to firearms. °Diet, exercise, and sleep habits. °Work and work environment. °Sunscreen use. °Safety issues such as seatbelt and bike helmet use. °Physical exam °Your health care provider will check your: °Height and weight. These may be used to calculate your BMI (body mass index). BMI is a measurement that tells if you are at a healthy weight. °Waist circumference. This measures the distance around your waistline. This measurement also tells if you are at a healthy weight and may help predict your risk of certain diseases, such as type 2 diabetes and high blood pressure. °Heart rate and blood pressure. °Body temperature. °Skin for abnormal spots. °What immunizations do I need? °Vaccines are usually given at various ages, according to a schedule. Your health care provider will recommend vaccines for you based on your age, medical history, and lifestyle or other factors, such as travel or where you work. °What tests do I need? °Screening °Your health care provider may recommend screening tests for certain conditions. This may include: °Lipid and cholesterol levels. °Hepatitis C test. °Hepatitis B test. °HIV (human immunodeficiency virus) test. °STI (sexually transmitted infection) testing, if you are at  risk. °Lung cancer screening. °Colorectal cancer screening. °Diabetes screening. This is done by checking your blood sugar (glucose) after you have not eaten for a while (fasting). °Mammogram. Talk with your health care provider about how often you should have regular mammograms. °BRCA-related cancer screening. This may be done if you have a family history of breast, ovarian, tubal, or peritoneal cancers. °Bone density scan. This is done to screen for osteoporosis. °Talk with your health care provider about your test results, treatment options, and if necessary, the need for more tests. °Follow these instructions at home: °Eating and drinking ° °Eat a diet that includes fresh fruits and vegetables, whole grains, lean protein, and low-fat dairy products. Limit your intake of foods with high amounts of sugar, saturated fats, and salt. °Take vitamin and mineral supplements as recommended by your health care provider. °Do not drink alcohol if your health care provider tells you not to drink. °If you drink alcohol: °Limit how much you have to 0-1 drink a day. °Know how much alcohol is in your drink. In the [place] one drink equals one 12 oz bottle of beer (355 mL), one 5 oz glass of wine (148 mL), or one 1½ oz glass of hard liquor (44 mL). °Lifestyle °Brush your teeth every morning and night with fluoride toothpaste. Floss one time each day. °Exercise for at least 30 minutes 5 or more days each week. °Do not use any products that contain nicotine or tobacco. These products include cigarettes, chewing tobacco, and vaping devices, such as e-cigarettes. If you need help quitting, ask your health care provider. °Do not use drugs. °If you are sexually active, practice safe sex. Use a condom or other form of protection in order to prevent STIs. °Take aspirin only as told by your   health care provider. Make sure that you understand how much to take and what form to take. Work with your health care provider to find out whether it  is safe and beneficial for you to take aspirin daily. Ask your health care provider if you need to take a cholesterol-lowering medicine (statin). Find healthy ways to manage stress, such as: Meditation, yoga, or listening to music. Journaling. Talking to a trusted person. Spending time with friends and family. Minimize exposure to UV radiation to reduce your risk of skin cancer. Safety Always wear your seat belt while driving or riding in a vehicle. Do not drive: If you have been drinking alcohol. Do not ride with someone who has been drinking. When you are tired or distracted. While texting. If you have been using any mind-altering substances or drugs. Wear a helmet and other protective equipment during sports activities. If you have firearms in your house, make sure you follow all gun safety procedures. What's next? Visit your health care provider once a year for an annual wellness visit. Ask your health care provider how often you should have your eyes and teeth checked. Stay up to date on all vaccines. This information is not intended to replace advice given to you by your health care provider. Make sure you discuss any questions you have with your health care provider. Document Revised: 03/15/2021 Document Reviewed: 03/15/2021 Elsevier Patient Education  [place].

## 2021-10-17 NOTE — Progress Notes (Signed)
Subjective:     Kerri Taylor is a 71 y.o. female and is here for a comprehensive physical exam. The patient reports no problems.  Social History   Socioeconomic History   Marital status: Married    Spouse name: Not on file   Number of children: Not on file   Years of education: Not on file   Highest education level: Not on file  Occupational History   Not on file  Tobacco Use   Smoking status: Never   Smokeless tobacco: Never  Substance and Sexual Activity   Alcohol use: Yes    Alcohol/week: 3.0 standard drinks    Types: 3 Cans of beer per week    Comment: occasional   Drug use: No   Sexual activity: Yes  Other Topics Concern   Not on file  Social History Narrative   Not on file   Social Determinants of Health   Financial Resource Strain: Not on file  Food Insecurity: Not on file  Transportation Needs: Not on file  Physical Activity: Not on file  Stress: Not on file  Social Connections: Not on file  Intimate Partner Violence: Not on file   Health Maintenance  Topic Date Due   Hepatitis C Screening  Never done   Zoster Vaccines- Shingrix (1 of 2) Never done   COVID-19 Vaccine (3 - Moderna risk series) 01/12/2020   INFLUENZA VACCINE  05/01/2021   TETANUS/TDAP  10/20/2022   MAMMOGRAM  03/09/2023   COLONOSCOPY (Pts 45-34yrs Insurance coverage will need to be confirmed)  05/04/2024   Pneumonia Vaccine 25+ Years old  Completed   DEXA SCAN  Completed   HPV VACCINES  Aged Out    The following portions of the patient's history were reviewed and updated as appropriate: allergies, current medications, past family history, past medical history, past social history, past surgical history, and problem list.  Review of Systems Pertinent items noted in HPI and remainder of comprehensive ROS otherwise negative.   Objective:    BP 111/75    Pulse 90    Temp 98.1 F (36.7 C)    Ht 5\' 4"  (1.626 m)    Wt 132 lb (59.9 kg)    SpO2 97%    BMI 22.66 kg/m  General appearance:  alert, cooperative, and no distress Head: Normocephalic, without obvious abnormality, atraumatic Eyes: conjunctivae/corneas clear. PERRL, EOM's intact. Fundi benign. Ears: normal TM's and external ear canals both ears Nose: Nares normal. Septum midline. Mucosa normal. No drainage or sinus tenderness. Throat: lips, mucosa, and tongue normal; teeth and gums normal Neck: no adenopathy, no JVD, supple, symmetrical, trachea midline, and thyroid: normal to inspection and palpation Back: symmetric, no curvature. ROM normal. No CVA tenderness. Lungs: clear to auscultation bilaterally Breasts: No nipple discharge or bleeding, Normal to palpation without dominant masses Heart: regular rate and rhythm and S1, S2 normal Abdomen: soft, non-tender; bowel sounds normal; no masses,  no organomegaly Pelvic: not indicated; status post hysterectomy, negative ROS Extremities: extremities normal, atraumatic, no cyanosis or edema Pulses: 2+ and symmetric Skin: mobility and turgor normal and no edema or two soft masses right lower leg noted, scattered SK lesions noted Lymph nodes: Cervical, supraclavicular, and axillary nodes normal. Neurologic: Grossly normal    Assessment:    Healthy female exam.      Plan:  -Will obtain routine fasting labs. -Pt deferred immunizations. -Recommend to follow a heart healthy diet and moderate aerobic exercise. -Continue to follow up with dermatology. Discussed small soft masses  are suggestive of lipomas, pt will further discuss with her dermatologist. Masses are asymptomatic, pt states its more cosmetic. -Follow up in 6 months for Mood,   See After Visit Summary for Counseling Recommendations

## 2021-10-18 LAB — COMPREHENSIVE METABOLIC PANEL
ALT: 24 IU/L (ref 0–32)
AST: 25 IU/L (ref 0–40)
Albumin/Globulin Ratio: 1.9 (ref 1.2–2.2)
Albumin: 4.7 g/dL (ref 3.7–4.7)
Alkaline Phosphatase: 96 IU/L (ref 44–121)
BUN/Creatinine Ratio: 13 (ref 12–28)
BUN: 12 mg/dL (ref 8–27)
Bilirubin Total: 0.5 mg/dL (ref 0.0–1.2)
CO2: 24 mmol/L (ref 20–29)
Calcium: 9.5 mg/dL (ref 8.7–10.3)
Chloride: 99 mmol/L (ref 96–106)
Creatinine, Ser: 0.93 mg/dL (ref 0.57–1.00)
Globulin, Total: 2.5 g/dL (ref 1.5–4.5)
Glucose: 97 mg/dL (ref 70–99)
Potassium: 4.4 mmol/L (ref 3.5–5.2)
Sodium: 140 mmol/L (ref 134–144)
Total Protein: 7.2 g/dL (ref 6.0–8.5)
eGFR: 65 mL/min/{1.73_m2} (ref >59)

## 2021-10-18 LAB — CBC WITH DIFFERENTIAL/PLATELET
Basophils Absolute: 0 10*3/uL (ref 0.0–0.2)
Basos: 1 %
EOS (ABSOLUTE): 0.1 10*3/uL (ref 0.0–0.4)
Eos: 3 %
Hematocrit: 44.3 % (ref 34.0–46.6)
Hemoglobin: 14.9 g/dL (ref 11.1–15.9)
Immature Grans (Abs): 0 10*3/uL (ref 0.0–0.1)
Immature Granulocytes: 0 %
Lymphocytes Absolute: 1.4 10*3/uL (ref 0.7–3.1)
Lymphs: 36 %
MCH: 31.2 pg (ref 26.6–33.0)
MCHC: 33.6 g/dL (ref 31.5–35.7)
MCV: 93 fL (ref 79–97)
Monocytes Absolute: 0.3 10*3/uL (ref 0.1–0.9)
Monocytes: 8 %
Neutrophils Absolute: 2 10*3/uL (ref 1.4–7.0)
Neutrophils: 52 %
Platelets: 362 10*3/uL (ref 150–450)
RBC: 4.77 x10E6/uL (ref 3.77–5.28)
RDW: 12.8 % (ref 11.7–15.4)
WBC: 3.9 10*3/uL (ref 3.4–10.8)

## 2021-10-18 LAB — HEMOGLOBIN A1C
Est. average glucose Bld gHb Est-mCnc: 114 mg/dL
Hgb A1c MFr Bld: 5.6 % (ref 4.8–5.6)

## 2021-10-18 LAB — LIPID PANEL
Chol/HDL Ratio: 3.8 ratio (ref 0.0–4.4)
Cholesterol, Total: 281 mg/dL — ABNORMAL HIGH (ref 100–199)
HDL: 73 mg/dL (ref >39)
LDL Chol Calc (NIH): 185 mg/dL — ABNORMAL HIGH (ref 0–99)
Triglycerides: 131 mg/dL (ref 0–149)
VLDL Cholesterol Cal: 23 mg/dL (ref 5–40)

## 2021-10-18 LAB — TSH: TSH: 3.36 u[IU]/mL (ref 0.450–4.500)

## 2021-10-18 LAB — VITAMIN D 25 HYDROXY (VIT D DEFICIENCY, FRACTURES): Vit D, 25-Hydroxy: 32.8 ng/mL (ref 30.0–100.0)

## 2022-01-13 ENCOUNTER — Other Ambulatory Visit: Payer: Self-pay | Admitting: Physician Assistant

## 2022-01-13 DIAGNOSIS — G8929 Other chronic pain: Secondary | ICD-10-CM

## 2022-03-14 ENCOUNTER — Other Ambulatory Visit: Payer: Self-pay | Admitting: Physician Assistant

## 2022-03-14 DIAGNOSIS — E785 Hyperlipidemia, unspecified: Secondary | ICD-10-CM

## 2022-03-14 DIAGNOSIS — F411 Generalized anxiety disorder: Secondary | ICD-10-CM

## 2022-04-03 ENCOUNTER — Ambulatory Visit
Admission: EM | Admit: 2022-04-03 | Discharge: 2022-04-03 | Disposition: A | Discharge status: Home / Self Care | Payer: Medicare Other | Attending: Student | Admitting: Student

## 2022-04-03 DIAGNOSIS — T7840XA Allergy, unspecified, initial encounter: Secondary | ICD-10-CM

## 2022-04-03 DIAGNOSIS — T63481A Toxic effect of venom of other arthropod, accidental (unintentional), initial encounter: Secondary | ICD-10-CM | POA: Diagnosis not present

## 2022-04-03 MED ORDER — DIPHENHYDRAMINE HCL 25 MG PO CAPS
25.0000 mg | ORAL_CAPSULE | Freq: Once | ORAL | Status: AC
  Administered 2022-04-03: 25 mg via ORAL

## 2022-04-03 MED ORDER — PREDNISONE 10 MG PO TABS: 20.0000 mg | ORAL_TABLET | Freq: Every day | ORAL | 0 refills | Status: AC

## 2022-04-03 MED ORDER — METHYLPREDNISOLONE SODIUM SUCC 125 MG IJ SOLR: 80.0000 mg | Freq: Once | INTRAMUSCULAR | Status: DC

## 2022-04-03 MED ORDER — HYDROXYZINE HCL 25 MG PO TABS
25.0000 mg | ORAL_TABLET | Freq: Four times a day (QID) | ORAL | 0 refills | Status: DC
Start: 2022-04-03 — End: 2023-04-22

## 2022-04-03 MED ORDER — DEXAMETHASONE SODIUM PHOSPHATE 10 MG/ML IJ SOLN
10.0000 mg | Freq: Once | INTRAMUSCULAR | Status: AC
  Administered 2022-04-03: 10 mg via INTRAMUSCULAR

## 2022-04-03 NOTE — ED Provider Notes (Signed)
[facility]    CSN: [id number] Arrival date & time: 04/03/22  1030      History   Chief Complaint Chief Complaint  Patient presents with   Insect Bite    HPI Kerri Taylor is a 71 y.o. female presenting with L arm allergic reaction following encounter with a stinging insect last night (16 hours). History similar reaction to wasp in the past, she is unsure if yesterday's insect was a wasp. Has attempted an NSAID which has provided minimal relief. Denies facial swelling, SOB, sensation throat closing, dizziness, weakness.    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      Home Medications    Prior to Admission medications   Medication Sig Start Date End Date Taking? Authorizing Provider  hydrOXYzine (ATARAX) 25 MG tablet Take 1 tablet (25 mg total) by mouth every 6 (six) hours. 04/03/22  Yes Hazel Sams, PA-C  predniSONE (DELTASONE) 10 MG tablet Take 2 tablets (20 mg total) by mouth daily for 3 days. 04/03/22 04/06/22 Yes  Hazel Sams, PA-C  atorvastatin (LIPITOR) 10 MG tablet TAKE 1 TABLET BY MOUTH EVERY DAY 03/14/22   Lorrene Reid, PA-C  cholecalciferol (VITAMIN D3) 25 MCG (1000 UT) tablet Take 1,000 Units by mouth daily.    [provider]  citalopram (CELEXA) 40 MG tablet TAKE 1 TABLET BY MOUTH EVERY DAY 03/14/22   Lorrene Reid, PA-C  conjugated estrogens (PREMARIN) vaginal cream Place vaginally daily. 07/30/17   Dorena Cookey, MD  cyclobenzaprine (FLEXERIL) 5 MG tablet TAKE 1 TABLET BY MOUTH AT BEDTIME AS NEEDED. 01/15/22   Lorrene Reid, PA-C  triamcinolone (KENALOG) 0.025 % ointment Apply topically 2 (two) times daily. 07/30/17   Dorena Cookey, MD    Family History Family History  Problem Relation Age of Onset   Colon cancer Brother 103   Aneurysm Mother    Healthy Sister    Suicidality Sister    Healthy Brother    Healthy Brother    Healthy Brother    Esophageal cancer Neg Hx    Rectal cancer Neg Hx    Stomach cancer Neg Hx     Social History Social History   Tobacco Use   Smoking status: Never   Smokeless tobacco: Never  Substance Use Topics   Alcohol use: Yes    Alcohol/week: 3.0 standard drinks of alcohol    Types: 3 Cans of beer  per week    Comment: occasional   Drug use: No     Allergies   Codeine   Review of Systems Review of Systems  Skin:  Positive for color change.  All other systems reviewed and are negative.    Physical Exam Triage Vital Signs ED Triage Vitals  Enc Vitals Group     BP 04/03/22 1042 (!) 166/82     Pulse Rate 04/03/22 1042 80     Resp 04/03/22 1042 18     Temp 04/03/22 1042 98.7 F (37.1 C)     Temp Source 04/03/22 1042 Oral     SpO2 04/03/22 1042 94 %     Weight --      Height --      Head Circumference --      Peak Flow --      Pain Score 04/03/22 1043 0     Pain Loc --      Pain Edu? --      Excl. in [place]? --    No data found.  Updated Vital Signs BP (!) 166/82 (BP Location: Right Arm)   Pulse 80   Temp  98.7 F (37.1 C) (Oral)   Resp 18   SpO2 94%   Visual Acuity Right Eye Distance:   Left Eye Distance:   Bilateral Distance:    Right Eye Near:   Left Eye Near:    Bilateral Near:     Physical Exam Vitals reviewed.  Constitutional:      General: She is not in acute distress.    Appearance: Normal appearance. She is not ill-appearing or diaphoretic.  HENT:     Head: Normocephalic and atraumatic.     Comments: No facial, lip, tongue, uvula swelling. Airway patent.  Cardiovascular:     Rate and Rhythm: Normal rate and regular rhythm.     Heart sounds: Normal heart sounds.  Pulmonary:     Effort: Pulmonary effort is normal.     Breath sounds: Normal breath sounds.  Skin:    General: Skin is warm.     Comments: L elbow - well circumscribed area of swelling and erythema overlying the olecranon and forearm. No induration, fluctuance, or streaking.   Neurological:     General: No focal deficit present.     Mental Status: She is alert and oriented to person, place, and time.  Psychiatric:        Mood and Affect: Mood normal.        Behavior: Behavior normal.        Thought Content: Thought content normal.        Judgment: Judgment normal.      UC Treatments / Results  Labs (all labs ordered are listed, but only abnormal results are displayed) Labs Reviewed - No data to display  EKG   Radiology No results found.  Procedures Procedures (including critical care time)  Medications Ordered in UC Medications  diphenhydrAMINE (BENADRYL) capsule 25 mg (25 mg Oral Given 04/03/22 1150)  dexamethasone (DECADRON) injection 10 mg (10 mg Intramuscular Given 04/03/22 1149)    Initial Impression / Assessment and Plan / UC Course  I have reviewed the triage vital signs and the nursing notes.  Pertinent labs & imaging results that were available during my care of the patient were reviewed by me and considered in my medical decision making (see chart for details).     This  patient is a very pleasant 71 y.o. year old female presenting with  localized allergic reaction following insect sting. No facial involvement. IM decadron administered. Benedryl PO administered. Hydroxyzine and short course of prednisone to start tomorrow sent. ED return precautions discussed. Patient verbalizes understanding and agreement. .   Final Clinical Impressions(s) / UC Diagnoses   Final diagnoses:  Allergic reaction, initial encounter  Insect stings, accidental or unintentional, initial encounter     Discharge Instructions      -Prednisone two pills daily x3 days. Start this tomorrow morning. Can give you energy. Take with food -Hydroxyzine as needed for itching, up to every 6 hours. This medication will make you drowsy, so avoid before driving or operating machinery. Do not drink alcohol while taking this medication.  -Avoid other antihistamines that will make you drowsy while taking this medication, like benedryl.    ED Prescriptions     Medication Sig Dispense Auth. Provider   predniSONE (DELTASONE) 10 MG tablet Take 2 tablets (20 mg total) by mouth daily for 3 days. 6 tablet Hazel Sams, PA-C   hydrOXYzine (ATARAX) 25 MG tablet Take 1 tablet (25 mg total) by mouth every 6 (six) hours. 12 tablet Hazel Sams, PA-C      PDMP not reviewed this encounter.   Hazel Sams, PA-C 04/03/22 1238

## 2022-04-03 NOTE — ED Triage Notes (Signed)
Pt presents with swelling & irritation to left arm after being bit by an insect last night.

## 2022-04-03 NOTE — Discharge Instructions (Addendum)
-  Prednisone two pills daily x3 days. Start this tomorrow morning. Can give you energy. Take with food -Hydroxyzine as needed for itching, up to every 6 hours. This medication will make you drowsy, so avoid before driving or operating machinery. Do not drink alcohol while taking this medication.  -Avoid other antihistamines that will make you drowsy while taking this medication, like benedryl.

## 2022-04-13 NOTE — Patient Instructions (Signed)

## 2022-04-16 ENCOUNTER — Ambulatory Visit (INDEPENDENT_AMBULATORY_CARE_PROVIDER_SITE_OTHER): Payer: Medicare Other | Admitting: Physician Assistant

## 2022-04-16 ENCOUNTER — Encounter: Payer: Self-pay | Admitting: Physician Assistant

## 2022-04-16 VITALS — BP 119/70 | HR 78 | Temp 97.7°F | Ht 64.0 in | Wt 132.0 lb

## 2022-04-16 DIAGNOSIS — G8929 Other chronic pain: Secondary | ICD-10-CM

## 2022-04-16 DIAGNOSIS — Z5181 Encounter for therapeutic drug level monitoring: Secondary | ICD-10-CM

## 2022-04-16 DIAGNOSIS — Z79899 Other long term (current) drug therapy: Secondary | ICD-10-CM

## 2022-04-16 DIAGNOSIS — M25519 Pain in unspecified shoulder: Secondary | ICD-10-CM | POA: Diagnosis not present

## 2022-04-16 DIAGNOSIS — E785 Hyperlipidemia, unspecified: Secondary | ICD-10-CM | POA: Diagnosis not present

## 2022-04-16 DIAGNOSIS — F419 Anxiety disorder, unspecified: Secondary | ICD-10-CM | POA: Diagnosis not present

## 2022-04-16 NOTE — Assessment & Plan Note (Signed)
-  Last lipid panel, LDL elevated at 185. At that time recommended to increase atorvastatin to 20 mg, pt continues with 10 mg. Patient is fasting today so will repeat lipid panel and hepatic function. Discussed with patient if LDL still elevated then recommend increasing atorvastatin to 20 mg daily, pt verbalized understanding and is agreeable. Continue low fat diet.

## 2022-04-16 NOTE — Assessment & Plan Note (Signed)
>>  ASSESSMENT AND PLAN FOR ANXIETY WRITTEN ON 04/16/2022 11:16 AM BY ABONZA, MARITZA, PA-C  -Stable. Continue current medication regimen. Will continue to monitor.

## 2022-04-16 NOTE — Progress Notes (Signed)
Established patient visit   Patient: Kerri Taylor   DOB: 01/05/1951   71 y.o. Female  MRN: [id number] Visit Date: 04/16/2022  Chief Complaint  Patient presents with   Follow-up   Subjective    HPI  Patient presents for mood and hyperlipidemia follow-up. Patient takes Flexeril when needed for her neck spasms/shoulder pain.   Mood: Reports medication compliance. States mood has been stable. Denies severe anxiety or labile mood. No SI/HI. No chest pain, palpitations or dizziness.   HLD: Pt reports started to wean off atorvastatin 10 mg, taking every other day because it did not seem to help her cholesterol. No myalgias. States continues with low fat diet.        04/16/2022   10:46 AM 10/17/2021   11:10 AM 10/06/2021    9:36 AM 02/28/2021    1:59 PM 05/06/2020   10:13 AM  Depression screen PHQ 2/9  Decreased Interest 0 0 3 0 0  Down, Depressed, Hopeless 0 0 0 0 0  PHQ - 2 Score 0 0 3 0 0  Altered sleeping 0 0 1 0 0  Tired, decreased energy 0 0 0 0 0  Change in appetite 0 0 0 0 0  Feeling bad or failure about yourself  0 0 0 0 0  Trouble concentrating 0 0 0 0 0  Moving slowly or fidgety/restless 0 0 0 0 0  Suicidal thoughts 0 0 0 0 0  PHQ-9 Score 0 0 4 0 0  Difficult doing work/chores Not difficult at all Not difficult at all Not difficult at all        04/16/2022   10:47 AM 10/17/2021   11:11 AM 10/06/2021    9:37 AM 02/28/2021    1:59 PM  GAD 7 : Generalized Anxiety Score  Nervous, Anxious, on Edge 0 0 0 0  Control/stop worrying 0 0 0 0  Worry too much - different things 0 0 0 0  Trouble relaxing 0 0 0 0  Restless 0 0 0 0  Easily annoyed or irritable 0 0 0 0  Afraid - awful might happen 0 0 0 0  Total GAD 7 Score 0 0 0 0  Anxiety Difficulty Not difficult at all Not difficult at all Not difficult at all         Medications: Outpatient Medications Prior to Visit  Medication Sig   atorvastatin (LIPITOR) 10 MG tablet TAKE 1 TABLET BY MOUTH EVERY DAY   cholecalciferol  (VITAMIN D3) 25 MCG (1000 UT) tablet Take 1,000 Units by mouth daily.   citalopram (CELEXA) 40 MG tablet TAKE 1 TABLET BY MOUTH EVERY DAY   conjugated estrogens (PREMARIN) vaginal cream Place vaginally daily.   cyclobenzaprine (FLEXERIL) 5 MG tablet TAKE 1 TABLET BY MOUTH AT BEDTIME AS NEEDED.   hydrOXYzine (ATARAX) 25 MG tablet Take 1 tablet (25 mg total) by mouth every 6 (six) hours.   triamcinolone (KENALOG) 0.025 % ointment Apply topically 2 (two) times daily.   No facility-administered medications prior to visit.    Review of Systems Review of Systems:  A fourteen system review of systems was performed and found to be positive as per HPI.   Last CBC Lab Results  Component Value Date   WBC 3.9 10/17/2021   HGB 14.9 10/17/2021   HCT 44.3 10/17/2021   MCV 93 10/17/2021   MCH 31.2 10/17/2021   RDW 12.8 10/17/2021   PLT 362 96/28/3662   Last metabolic panel Lab Results  Component Value  Date   GLUCOSE 97 10/17/2021   NA 140 10/17/2021   K 4.4 10/17/2021   CL 99 10/17/2021   CO2 24 10/17/2021   BUN 12 10/17/2021   CREATININE 0.93 10/17/2021   EGFR 65 10/17/2021   CALCIUM 9.5 10/17/2021   PROT 7.2 10/17/2021   ALBUMIN 4.7 10/17/2021   LABGLOB 2.5 10/17/2021   AGRATIO 1.9 10/17/2021   BILITOT 0.5 10/17/2021   ALKPHOS 96 10/17/2021   AST 25 10/17/2021   ALT 24 10/17/2021   Last lipids Lab Results  Component Value Date   CHOL 281 (H) 10/17/2021   HDL 73 10/17/2021   LDLCALC 185 (H) 10/17/2021   LDLDIRECT 199.7 10/19/2013   TRIG 131 10/17/2021   CHOLHDL 3.8 10/17/2021   Last hemoglobin A1c Lab Results  Component Value Date   HGBA1C 5.6 10/17/2021   Last thyroid functions Lab Results  Component Value Date   TSH 3.360 10/17/2021   Last vitamin D Lab Results  Component Value Date   VD25OH 32.8 10/17/2021     Objective    BP 119/70   Pulse 78   Temp 97.7 F (36.5 C)   Ht _0  (1.626 m)   Wt 132 lb (59.9 kg)   SpO2 96%   BMI 22.66 kg/m  BP  Readings from Last 3 Encounters:  04/16/22 119/70  04/03/22 (!) 166/82  10/17/21 111/75   Wt Readings from Last 3 Encounters:  04/16/22 132 lb (59.9 kg)  10/17/21 132 lb (59.9 kg)  10/06/21 134 lb (60.8 kg)    Physical Exam  General:  Well Developed, well nourished, appropriate for stated age.  Neuro:  Alert and oriented,  extra-ocular muscles intact  HEENT:  Normocephalic, atraumatic, neck supple  Skin:  no gross rash, warm, pink. Cardiac:  RRR, S1 S2 Respiratory: CTA B/L  Vascular:  Ext warm, no cyanosis apprec.; cap RF less 2 sec. No edema.  Psych:  No HI/SI, judgement and insight good, Euthymic mood. Full Affect.   No results found for any visits on 04/16/22.  Assessment & Plan      Problem List Items Addressed This Visit       Other   Hyperlipidemia - Primary    -Last lipid panel, LDL elevated at 185. At that time recommended to increase atorvastatin to 20 mg, pt continues with 10 mg. Patient is fasting today so will repeat lipid panel and hepatic function. Discussed with patient if LDL still elevated then recommend increasing atorvastatin to 20 mg daily, pt verbalized understanding and is agreeable. Continue low fat diet.       Relevant Orders   Comp Met (CMET)   Lipid Profile   Anxiety    -Stable. Continue current medication regimen. Will continue to monitor.      Other Visit Diagnoses     Encounter for monitoring statin therapy       Relevant Orders   Comp Met (CMET)   Lipid Profile   Chronic shoulder pain, unspecified laterality           Chronic shoulder pain: -Stable. Continue current medication regimen. Will continue to monitor.  Return in about 6 months (around 10/17/2022) for [place] and [place].        Lorrene Reid, PA-C  [facility] [phone] (phone) [phone] (fax)  [place]

## 2022-04-16 NOTE — Assessment & Plan Note (Signed)
-  Stable. -Continue current medication regimen.  -Will continue to monitor. 

## 2022-04-17 LAB — LIPID PANEL
Chol/HDL Ratio: 3.3 ratio (ref 0.0–4.4)
Cholesterol, Total: 288 mg/dL — ABNORMAL HIGH (ref 100–199)
HDL: 87 mg/dL (ref >39)
LDL Chol Calc (NIH): 189 mg/dL — ABNORMAL HIGH (ref 0–99)
Triglycerides: 76 mg/dL (ref 0–149)
VLDL Cholesterol Cal: 12 mg/dL (ref 5–40)

## 2022-04-17 LAB — COMPREHENSIVE METABOLIC PANEL
ALT: 19 IU/L (ref 0–32)
AST: 16 IU/L (ref 0–40)
Albumin/Globulin Ratio: 2 (ref 1.2–2.2)
Albumin: 4.5 g/dL (ref 3.8–4.8)
Alkaline Phosphatase: 78 IU/L (ref 44–121)
BUN/Creatinine Ratio: 17 (ref 12–28)
BUN: 12 mg/dL (ref 8–27)
Bilirubin Total: 0.5 mg/dL (ref 0.0–1.2)
CO2: 23 mmol/L (ref 20–29)
Calcium: 9.1 mg/dL (ref 8.7–10.3)
Chloride: 97 mmol/L (ref 96–106)
Creatinine, Ser: 0.71 mg/dL (ref 0.57–1.00)
Globulin, Total: 2.3 g/dL (ref 1.5–4.5)
Glucose: 91 mg/dL (ref 70–99)
Potassium: 4.5 mmol/L (ref 3.5–5.2)
Sodium: 135 mmol/L (ref 134–144)
Total Protein: 6.8 g/dL (ref 6.0–8.5)
eGFR: 90 mL/min/{1.73_m2} (ref >59)

## 2022-07-05 ENCOUNTER — Ambulatory Visit (INDEPENDENT_AMBULATORY_CARE_PROVIDER_SITE_OTHER): Payer: Medicare Other | Admitting: Physician Assistant

## 2022-07-05 VITALS — BP 124/76

## 2022-07-05 DIAGNOSIS — E785 Hyperlipidemia, unspecified: Secondary | ICD-10-CM | POA: Diagnosis not present

## 2022-07-05 DIAGNOSIS — G8929 Other chronic pain: Secondary | ICD-10-CM

## 2022-07-05 DIAGNOSIS — F411 Generalized anxiety disorder: Secondary | ICD-10-CM

## 2022-07-05 DIAGNOSIS — Z23 Encounter for immunization: Secondary | ICD-10-CM | POA: Diagnosis not present

## 2022-07-05 DIAGNOSIS — M25519 Pain in unspecified shoulder: Secondary | ICD-10-CM | POA: Diagnosis not present

## 2022-07-05 MED ORDER — CITALOPRAM HYDROBROMIDE 40 MG PO TABS: 40.0000 mg | ORAL_TABLET | Freq: Every day | ORAL | 1 refills | Status: DC

## 2022-07-05 MED ORDER — CYCLOBENZAPRINE HCL 10 MG PO TABS: 10.0000 mg | ORAL_TABLET | Freq: Every evening | ORAL | 1 refills | Status: DC | PRN

## 2022-07-05 MED ORDER — ATORVASTATIN CALCIUM 20 MG PO TABS: 20.0000 mg | ORAL_TABLET | Freq: Every day | ORAL | 1 refills | Status: DC

## 2022-07-05 NOTE — Progress Notes (Signed)
Established patient visit   Patient: Kerri Taylor   DOB: 1951-01-31   71 y.o. Female  MRN: [id number] Visit Date: 07/05/2022  No chief complaint on file.  Subjective    HPI  Patient presents for follow-up on hyperlipidemia. Reports tolerating atorvastatin 20 mg without issues. Continues to stay as active as possible. Follows a low fat diet. Reports mood has been good, needs refill on Celexa. Takes Flexeril as needed for shoulder pain and neck tension but 5 mg not as effective as before.       07/05/2022    1:21 PM 04/16/2022   10:46 AM 10/17/2021   11:10 AM 10/06/2021    9:36 AM 02/28/2021    1:59 PM  Depression screen PHQ 2/9  Decreased Interest 0 0 0 3 0  Down, Depressed, Hopeless 0 0 0 0 0  PHQ - 2 Score 0 0 0 3 0  Altered sleeping 0 0 0 1 0  Tired, decreased energy 0 0 0 0 0  Change in appetite 0 0 0 0 0  Feeling bad or failure about yourself  0 0 0 0 0  Trouble concentrating 0 0 0 0 0  Moving slowly or fidgety/restless 0 0 0 0 0  Suicidal thoughts 0 0 0 0 0  PHQ-9 Score 0 0 0 4 0  Difficult doing work/chores Not difficult at all Not difficult at all Not difficult at all Not difficult at all       07/05/2022    1:21 PM 04/16/2022   10:47 AM 10/17/2021   11:11 AM 10/06/2021    9:37 AM  GAD 7 : Generalized Anxiety Score  Nervous, Anxious, on Edge 0 0 0 0  Control/stop worrying 0 0 0 0  Worry too much - different things 0 0 0 0  Trouble relaxing 0 0 0 0  Restless 0 0 0 0  Easily annoyed or irritable 0 0 0 0  Afraid - awful might happen 0 0 0 0  Total GAD 7 Score 0 0 0 0  Anxiety Difficulty Not difficult at all Not difficult at all Not difficult at all Not difficult at all      Medications: Outpatient Medications Prior to Visit  Medication Sig   cholecalciferol (VITAMIN D3) 25 MCG (1000 UT) tablet Take 1,000 Units by mouth daily.   conjugated estrogens (PREMARIN) vaginal cream Place vaginally daily.   hydrOXYzine (ATARAX) 25 MG tablet Take 1 tablet (25 mg total) by  mouth every 6 (six) hours.   triamcinolone (KENALOG) 0.025 % ointment Apply topically 2 (two) times daily.   [DISCONTINUED] atorvastatin (LIPITOR) 10 MG tablet TAKE 1 TABLET BY MOUTH EVERY DAY   [DISCONTINUED] citalopram (CELEXA) 40 MG tablet TAKE 1 TABLET BY MOUTH EVERY DAY   [DISCONTINUED] cyclobenzaprine (FLEXERIL) 5 MG tablet TAKE 1 TABLET BY MOUTH AT BEDTIME AS NEEDED.   No facility-administered medications prior to visit.    Review of Systems Review of Systems:  A fourteen system review of systems was performed and found to be positive as per HPI.  Last CBC Lab Results  Component Value Date   WBC 3.9 10/17/2021   HGB 14.9 10/17/2021   HCT 44.3 10/17/2021   MCV 93 10/17/2021   MCH 31.2 10/17/2021   RDW 12.8 10/17/2021   PLT 362 20/35/5974   Last metabolic panel Lab Results  Component Value Date   GLUCOSE 91 04/16/2022   NA 135 04/16/2022   K 4.5 04/16/2022   CL 97 04/16/2022  CO2 23 04/16/2022   BUN 12 04/16/2022   CREATININE 0.71 04/16/2022   EGFR 90 04/16/2022   CALCIUM 9.1 04/16/2022   PROT 6.8 04/16/2022   ALBUMIN 4.5 04/16/2022   LABGLOB 2.3 04/16/2022   AGRATIO 2.0 04/16/2022   BILITOT 0.5 04/16/2022   ALKPHOS 78 04/16/2022   AST 16 04/16/2022   ALT 19 04/16/2022   Last lipids Lab Results  Component Value Date   CHOL 288 (H) 04/16/2022   HDL 87 04/16/2022   LDLCALC 189 (H) 04/16/2022   LDLDIRECT 199.7 10/19/2013   TRIG 76 04/16/2022   CHOLHDL 3.3 04/16/2022   Last hemoglobin A1c Lab Results  Component Value Date   HGBA1C 5.6 10/17/2021   Last thyroid functions Lab Results  Component Value Date   TSH 3.360 10/17/2021       Objective    BP 124/76  BP Readings from Last 3 Encounters:  07/05/22 124/76  04/16/22 119/70  04/03/22 (!) 166/82   Wt Readings from Last 3 Encounters:  04/16/22 132 lb (59.9 kg)  10/17/21 132 lb (59.9 kg)  10/06/21 134 lb (60.8 kg)    Physical Exam  General:  Well Developed, well nourished, appropriate  for stated age.  Neuro:  Alert and oriented,  extra-ocular muscles intact  HEENT:  Normocephalic, atraumatic, neck supple  Skin:  no gross rash, warm, pink. Cardiac:  RRR Respiratory: Speaking in full sentences, unlabored. Vascular:  Ext warm, no cyanosis apprec.; cap RF less 2 sec. Psych:  No HI/SI, judgement and insight good, Euthymic mood. Full Affect.   No results found for any visits on 07/05/22.  Assessment & Plan      Problem List Items Addressed This Visit       Other   ANXIETY DISORDER, GENERALIZED    -Controlled. Continue current medication regimen, provided refills.      Relevant Medications   citalopram (CELEXA) 40 MG tablet   Hyperlipidemia - Primary    -Last lipid panel: HDL 87, LDL 189 and atorvastatin was increased to 20 mg. Will repeat lipid panel and hepatic function today. Recommend to continue a heart healthy diet low in fat.       Relevant Medications   atorvastatin (LIPITOR) 20 MG tablet   Other Relevant Orders   Lipid Profile   Comp Met (CMET)   Other Visit Diagnoses     Chronic shoulder pain, unspecified laterality       Relevant Medications   citalopram (CELEXA) 40 MG tablet   cyclobenzaprine (FLEXERIL) 10 MG tablet   Flu vaccine need       Need for immunization against influenza       Relevant Orders   Flu Vaccine QUAD High Dose(Fluad) (Completed)      Chronic shoulder pain: -Will increase Flexeril to 10 mg to take at bedtime as needed. Also recommend gentle stretches and exercises.  Return for as scheduled.        Lorrene Reid, PA-C  [facility] [phone] (phone) [phone] (fax)  [place]

## 2022-07-05 NOTE — Assessment & Plan Note (Signed)
-  Last lipid panel: HDL 87, LDL 189 and atorvastatin was increased to 20 mg. Will repeat lipid panel and hepatic function today. Recommend to continue a heart healthy diet low in fat.

## 2022-07-05 NOTE — Assessment & Plan Note (Signed)
-  Controlled. Continue current medication regimen, provided refills.

## 2022-07-05 NOTE — Patient Instructions (Signed)

## 2022-07-06 LAB — LIPID PANEL
Chol/HDL Ratio: 2.4 ratio (ref 0.0–4.4)
Cholesterol, Total: 222 mg/dL — ABNORMAL HIGH (ref 100–199)
HDL: 92 mg/dL (ref >39)
LDL Chol Calc (NIH): 121 mg/dL — ABNORMAL HIGH (ref 0–99)
Triglycerides: 54 mg/dL (ref 0–149)
VLDL Cholesterol Cal: 9 mg/dL (ref 5–40)

## 2022-07-06 LAB — COMPREHENSIVE METABOLIC PANEL
ALT: 22 IU/L (ref 0–32)
AST: 24 IU/L (ref 0–40)
Albumin/Globulin Ratio: 2 (ref 1.2–2.2)
Albumin: 4.5 g/dL (ref 3.8–4.8)
Alkaline Phosphatase: 82 IU/L (ref 44–121)
BUN/Creatinine Ratio: 15 (ref 12–28)
BUN: 10 mg/dL (ref 8–27)
Bilirubin Total: 0.4 mg/dL (ref 0.0–1.2)
CO2: 24 mmol/L (ref 20–29)
Calcium: 9 mg/dL (ref 8.7–10.3)
Chloride: 101 mmol/L (ref 96–106)
Creatinine, Ser: 0.67 mg/dL (ref 0.57–1.00)
Globulin, Total: 2.3 g/dL (ref 1.5–4.5)
Glucose: 86 mg/dL (ref 70–99)
Potassium: 4.5 mmol/L (ref 3.5–5.2)
Sodium: 139 mmol/L (ref 134–144)
Total Protein: 6.8 g/dL (ref 6.0–8.5)
eGFR: 92 mL/min/{1.73_m2} (ref >59)

## 2022-10-10 ENCOUNTER — Other Ambulatory Visit: Payer: Self-pay | Admitting: Nurse Practitioner

## 2022-10-10 ENCOUNTER — Telehealth: Payer: Self-pay | Admitting: *Deleted

## 2022-10-10 DIAGNOSIS — U071 COVID-19: Secondary | ICD-10-CM

## 2022-10-10 MED ORDER — MOLNUPIRAVIR EUA 200MG CAPSULE: 4.0000 | ORAL_CAPSULE | Freq: Two times a day (BID) | ORAL | 0 refills | Status: AC

## 2022-10-10 NOTE — Telephone Encounter (Signed)
Please let her know that she is within the 5 day period. I have sent her molnupirivir to treat the virus. She should take 4 capsules twice daily for 5 days. hould also take OTC zinc, vitamin d, and vitamin c every day. She can also use otc medications as needed and as indicated for symptom management.  Thanks so much.   -HB

## 2022-10-10 NOTE — Telephone Encounter (Signed)
Pt called stating that she has covid, symptoms started on 10/07/22. She tested positive on Tuesday.  Husband is also positive she said. She is wanting to know if she can have medication sent in for this.  Informed her that I would send message and that if she is outside of the window for medication that she can treat the symptoms with OTC medication. Please advise.

## 2022-10-11 NOTE — Telephone Encounter (Signed)
Pt informed of below.Kerri Taylor, CMA ? ?

## 2022-10-22 ENCOUNTER — Other Ambulatory Visit: Payer: Self-pay | Admitting: Nurse Practitioner

## 2022-10-22 DIAGNOSIS — E785 Hyperlipidemia, unspecified: Secondary | ICD-10-CM

## 2022-10-22 DIAGNOSIS — Z0000 Encounter for general adult medical examination without abnormal findings: Secondary | ICD-10-CM

## 2022-11-06 ENCOUNTER — Other Ambulatory Visit: Payer: Medicare Other

## 2022-11-06 DIAGNOSIS — E785 Hyperlipidemia, unspecified: Secondary | ICD-10-CM

## 2022-11-06 DIAGNOSIS — Z0000 Encounter for general adult medical examination without abnormal findings: Secondary | ICD-10-CM

## 2022-11-07 LAB — COMPREHENSIVE METABOLIC PANEL
ALT: 25 IU/L (ref 0–32)
AST: 26 IU/L (ref 0–40)
Albumin/Globulin Ratio: 2 (ref 1.2–2.2)
Albumin: 4.4 g/dL (ref 3.8–4.8)
Alkaline Phosphatase: 92 IU/L (ref 44–121)
BUN/Creatinine Ratio: 15 (ref 12–28)
BUN: 13 mg/dL (ref 8–27)
Bilirubin Total: 0.4 mg/dL (ref 0.0–1.2)
CO2: 24 mmol/L (ref 20–29)
Calcium: 9.2 mg/dL (ref 8.7–10.3)
Chloride: 101 mmol/L (ref 96–106)
Creatinine, Ser: 0.84 mg/dL (ref 0.57–1.00)
Globulin, Total: 2.2 g/dL (ref 1.5–4.5)
Glucose: 95 mg/dL (ref 70–99)
Potassium: 4.4 mmol/L (ref 3.5–5.2)
Sodium: 139 mmol/L (ref 134–144)
Total Protein: 6.6 g/dL (ref 6.0–8.5)
eGFR: 73 mL/min/{1.73_m2} (ref >59)

## 2022-11-07 LAB — CBC WITH DIFFERENTIAL/PLATELET
Basophils Absolute: 0 10*3/uL (ref 0.0–0.2)
Basos: 1 %
EOS (ABSOLUTE): 0.2 10*3/uL (ref 0.0–0.4)
Eos: 4 %
Hematocrit: 39.9 % (ref 34.0–46.6)
Hemoglobin: 13.9 g/dL (ref 11.1–15.9)
Immature Grans (Abs): 0 10*3/uL (ref 0.0–0.1)
Immature Granulocytes: 0 %
Lymphocytes Absolute: 1.6 10*3/uL (ref 0.7–3.1)
Lymphs: 44 %
MCH: 32 pg (ref 26.6–33.0)
MCHC: 34.8 g/dL (ref 31.5–35.7)
MCV: 92 fL (ref 79–97)
Monocytes Absolute: 0.4 10*3/uL (ref 0.1–0.9)
Monocytes: 10 %
Neutrophils Absolute: 1.5 10*3/uL (ref 1.4–7.0)
Neutrophils: 41 %
Platelets: 291 10*3/uL (ref 150–450)
RBC: 4.35 x10E6/uL (ref 3.77–5.28)
RDW: 13 % (ref 11.7–15.4)
WBC: 3.7 10*3/uL (ref 3.4–10.8)

## 2022-11-07 LAB — LIPID PANEL
Chol/HDL Ratio: 2.9 ratio (ref 0.0–4.4)
Cholesterol, Total: 220 mg/dL — ABNORMAL HIGH (ref 100–199)
HDL: 75 mg/dL (ref >39)
LDL Chol Calc (NIH): 133 mg/dL — ABNORMAL HIGH (ref 0–99)
Triglycerides: 66 mg/dL (ref 0–149)
VLDL Cholesterol Cal: 12 mg/dL (ref 5–40)

## 2022-11-07 LAB — HEMOGLOBIN A1C
Est. average glucose Bld gHb Est-mCnc: 114 mg/dL
Hgb A1c MFr Bld: 5.6 % (ref 4.8–5.6)

## 2022-11-07 LAB — TSH: TSH: 6.34 u[IU]/mL — ABNORMAL HIGH (ref 0.450–4.500)

## 2022-11-13 ENCOUNTER — Ambulatory Visit (INDEPENDENT_AMBULATORY_CARE_PROVIDER_SITE_OTHER): Payer: Medicare Other | Admitting: Family Medicine

## 2022-11-13 ENCOUNTER — Encounter: Payer: Self-pay | Admitting: Family Medicine

## 2022-11-13 VITALS — BP 126/82 | HR 72 | Resp 20 | Ht 64.0 in | Wt 137.0 lb

## 2022-11-13 DIAGNOSIS — R7989 Other specified abnormal findings of blood chemistry: Secondary | ICD-10-CM

## 2022-11-13 DIAGNOSIS — Z0000 Encounter for general adult medical examination without abnormal findings: Secondary | ICD-10-CM

## 2022-11-13 DIAGNOSIS — F411 Generalized anxiety disorder: Secondary | ICD-10-CM | POA: Diagnosis not present

## 2022-11-13 DIAGNOSIS — E7849 Other hyperlipidemia: Secondary | ICD-10-CM

## 2022-11-13 DIAGNOSIS — M8580 Other specified disorders of bone density and structure, unspecified site: Secondary | ICD-10-CM

## 2022-11-13 NOTE — Assessment & Plan Note (Addendum)
Stable.  Taking Celexa 40 mg daily and hydroxyzine 25 mg.  Continue medications as prescribed.

## 2022-11-13 NOTE — Assessment & Plan Note (Signed)
Bone marrow density scan in 2016 showed osteopenia.  She takes cholecalciferol vitamin D3 1000 units daily.  We discussed getting a new another bone marrow density to see whether the osteopenia is stable or progressing, but she would like to defer the scan to next year.  Discussed the risks and benefits of getting a bone marrow density scan.

## 2022-11-13 NOTE — Assessment & Plan Note (Signed)
Previously taking atorvastatin 20 mg but stated that the increase to 20 mg was causing some cramping.  She decided to discontinue the medication and would like to manage her cholesterol using co-Q10, red yeast rice, and a low-fat diet.  We discussed the risks of having high cholesterol and the necessity to keep cholesterol low.  We discussed that it could be an option to change to a different medication for hyperlipidemia, but she would prefer to try the supplements as she does not like taking medications.  She has agreed to follow-up in 3 months for fasting blood work to check where her lipid levels are at.  At that time, if cholesterol is not under control then we will discussed starting a different medication.  Most recent lipid panel done 11/06/2022.  Total cholesterol 220, LDL 133, HDL 75.

## 2022-11-13 NOTE — Progress Notes (Signed)
Subjective:   Kerri Taylor is a 72 y.o. female who presents for Medicare Annual (Subsequent) preventive examination.  Patient states that overall she is doing well.  She did experience some muscle cramping and myalgias while taking the atorvastatin 20 mg, so she is decided to discontinue that medication and would like to try managing cholesterol with low-fat diet, co-Q10, and red yeast rice.    TSH was elevated at 6.3 on 11/06/2022, she states that she has never had any issues with her thyroid before.  She denies any symptoms including palpitations, heat intolerance, and changes in her skin.  She did say that she had COVID in January which took some time to recover from.  We will retest TSH and T4 today.  Review of Systems    ROS   See HPI     Objective:    Today's Vitals   11/13/22 1116  BP: 126/82  Pulse: 72  Resp: 20  SpO2: 99%  Weight: 137 lb (62.1 kg)  Height: 5' 4"$  (1.626 m)   Body mass index is 23.52 kg/m.      No data to display          Current Medications (verified) Outpatient Encounter Medications as of 11/13/2022  Medication Sig   cholecalciferol (VITAMIN D3) 25 MCG (1000 UT) tablet Take 1,000 Units by mouth daily.   citalopram (CELEXA) 40 MG tablet Take 1 tablet (40 mg total) by mouth daily.   conjugated estrogens (PREMARIN) vaginal cream Place vaginally daily.   cyclobenzaprine (FLEXERIL) 10 MG tablet Take 1 tablet (10 mg total) by mouth at bedtime as needed for muscle spasms.   hydrOXYzine (ATARAX) 25 MG tablet Take 1 tablet (25 mg total) by mouth every 6 (six) hours.   triamcinolone (KENALOG) 0.025 % ointment Apply topically 2 (two) times daily.   [DISCONTINUED] atorvastatin (LIPITOR) 20 MG tablet Take 1 tablet (20 mg total) by mouth daily. (Patient not taking: Reported on 11/13/2022)   No facility-administered encounter medications on file as of 11/13/2022.    Allergies (verified) Codeine   History: Past Medical History:  Diagnosis Date    Anxiety    Cancer ([place])    Malignant melanoma of skin of right thigh ([place])    Past Surgical History:  Procedure Laterality Date   ABDOMINAL HYSTERECTOMY  1990   BREAST BIOPSY     cyst removed right breast/benign   BREAST CYST EXCISION Right    CYST REMOVAL NECK  1985   back of neck, benign   PATELLA FRACTURE SURGERY  2013   right   Family History  Problem Relation Age of Onset   Colon cancer Brother 29   Aneurysm Mother    Healthy Sister    Suicidality Sister    Healthy Brother    Healthy Brother    Healthy Brother    Esophageal cancer Neg Hx    Rectal cancer Neg Hx    Stomach cancer Neg Hx    Social History   Socioeconomic History   Marital status: Married    Spouse name: Not on file   Number of children: Not on file   Years of education: Not on file   Highest education level: Not on file  Occupational History   Not on file  Tobacco Use   Smoking status: Never   Smokeless tobacco: Never  Substance and Sexual Activity   Alcohol use: Yes    Alcohol/week: 3.0 standard drinks of alcohol    Types: 3 Cans of beer  per week    Comment: occasional   Drug use: No   Sexual activity: Yes  Other Topics Concern   Not on file  Social History Narrative   Not on file   Social Determinants of Health   Financial Resource Strain: Low Risk  (11/13/2022)   Overall Financial Resource Strain (CARDIA)    Difficulty of Paying Living Expenses: Not hard at all  Food Insecurity: No Food Insecurity (11/13/2022)   Hunger Vital Sign    Worried About Running Out of Food in the Last Year: Never true    Ran Out of Food in the Last Year: Never true  Transportation Needs: No Transportation Needs (11/13/2022)   PRAPARE - Hydrologist (Medical): No    Lack of Transportation (Non-Medical): No  Physical Activity: Sufficiently Active (11/13/2022)   Exercise Vital Sign    Days of Exercise per Week: 5 days    Minutes of Exercise per Session: 70 min  Stress: No  Stress Concern Present (11/13/2022)   [place]    Feeling of Stress : Not at all  Social Connections: Moderately Isolated (11/13/2022)   Social Connection and Isolation Panel [NHANES]    Frequency of Communication with Friends and Family: More than three times a week    Frequency of Social Gatherings with Friends and Family: More than three times a week    Attends Religious Services: Never    Marine scientist or Organizations: No    Attends Music therapist: Never    Marital Status: Married    Tobacco Counseling Counseling given: Not Answered   Clinical Intake:  Pre-visit preparation completed: No  Pain : No/denies pain     BMI - recorded: 23.5 Nutritional Status: BMI of 19-24  Normal Nutritional Risks: None Diabetes: No  How often do you need to have someone help you when you read instructions, pamphlets, or other written materials from your doctor or pharmacy?: 1 - Never  Diabetic? No  Interpreter Needed?: No  Information entered by :: Leanord Asal, RMA   Activities of Daily Living    11/13/2022   11:17 AM 07/05/2022    1:21 PM  In your present state of health, do you have any difficulty performing the following activities:  Hearing? 0 0  Vision? 0 0  Difficulty concentrating or making decisions? 0 0  Walking or climbing stairs? 0 0  Dressing or bathing? 0 0  Doing errands, shopping? 0 0    Patient Care Team: Lorrene Reid, PA-C as PCP - General (Physician Assistant) Sydnee Levans, MD as Referring Physician (Dermatology) Gatha Mayer, MD as Consulting Physician (Gastroenterology)  Indicate any recent Medical Services you may have received from other than [facility] providers in the past year (date may be approximate).     Assessment:   This is a routine wellness examination for Kerri Taylor.  Hearing/Vision screen Hearing Screening - Comments:: No hearing aides or  hearing loss Vision Screening - Comments:: Wears glasses. Followed by [place] issues and exercise activities discussed:     Goals Addressed               This Visit's Progress     Patient Stated (pt-stated)        Like to control her cholesterol levels using just supplements, diet, and exercise this year.  Overall she feels good, but she does not like taking medication and hopes that her  new supplement regimen of red yeast rice and co-Q10 along with diet and exercise will be enough to keep her cholesterol levels low      Depression Screen    11/13/2022   11:17 AM 07/05/2022    1:21 PM 04/16/2022   10:46 AM 10/17/2021   11:10 AM 10/06/2021    9:36 AM 02/28/2021    1:59 PM 05/06/2020   10:13 AM  PHQ 2/9 Scores  PHQ - 2 Score 0 0 0 0 3 0 0  PHQ- 9 Score 0 0 0 0 4 0 0    Fall Risk    11/13/2022   11:17 AM 07/05/2022    1:20 PM 04/16/2022   10:46 AM 10/17/2021   11:10 AM 10/06/2021    9:36 AM  Fall Risk   Falls in the past year? 0 0 0 0 0  Number falls in past yr: 0 0 0 0 0  Injury with Fall? 0 0 0 0 0  Risk for fall due to :  No Fall Risks No Fall Risks No Fall Risks No Fall Risks  Follow up  Falls evaluation completed Falls evaluation completed Falls evaluation completed Falls evaluation completed    [place]:  Any stairs in or around the home? Yes  If so, are there any without handrails? No  Home free of loose throw rugs in walkways, pet beds, electrical cords, etc? Yes  Adequate lighting in your home to reduce risk of falls? Yes   ASSISTIVE DEVICES UTILIZED TO PREVENT FALLS:  Life alert? No  Use of a cane, walker or w/c? No  Grab bars in the bathroom? Yes  Shower chair or bench in shower? No  Elevated toilet seat or a handicapped toilet? Yes   TIMED UP AND GO:  Was the test performed? Yes .  Length of time to ambulate 10 feet: 10-20 sec.   Gait steady and fast without use of assistive device  Cognitive  Function:        11/13/2022   11:06 AM 02/28/2021    1:48 PM 11/03/2019    1:22 PM  6CIT Screen  What Year? 0 points 0 points 0 points  What month? 0 points 0 points 0 points  What time? 0 points 0 points 0 points  Count back from 20 0 points 0 points 0 points  Months in reverse 0 points 0 points 0 points  Repeat phrase 0 points 0 points 2 points  Total Score 0 points 0 points 2 points    Immunizations Immunization History  Administered Date(s) Administered   Fluad Quad(high Dose 65+) 07/29/2019, 07/05/2022   Influenza, High Dose Seasonal PF 07/30/2017   Influenza-Unspecified 08/02/2020   Moderna Sars-Covid-2 Vaccination 11/16/2019, 12/15/2019, 08/22/2020   Pneumococcal Conjugate-13 01/17/2015   Pneumococcal Polysaccharide-23 07/30/2017   Td 10/01/2000   Tdap 10/20/2012    TDAP status: Due, Education has been provided regarding the importance of this vaccine. Advised may receive this vaccine at local pharmacy or Health Dept. Aware to provide a copy of the vaccination record if obtained from local pharmacy or Health Dept. Verbalized acceptance and understanding.  Flu Vaccine status: Up to date  Pneumococcal vaccine status: Up to date  Covid-19 vaccine status: Information provided on how to obtain vaccines.   Qualifies for Shingles Vaccine? Yes   Zostavax completed Yes   Shingrix Completed?: Yes  Screening Tests Health Maintenance  Topic Date Due   Hepatitis C Screening  Never done  Zoster Vaccines- Shingrix (1 of 2) Never done   COVID-19 Vaccine (4 - 2023-24 season) 06/01/2022   DTaP/Tdap/Td (3 - Td or Tdap) 10/20/2022   MAMMOGRAM  03/09/2023   Medicare Annual Wellness (AWV)  11/14/2023   COLONOSCOPY (Pts 45-77yr Insurance coverage will need to be confirmed)  05/04/2024   Pneumonia Vaccine 74 Years old  Completed   INFLUENZA VACCINE  Completed   DEXA SCAN  Completed   HPV VACCINES  Aged Out    Health Maintenance  Health Maintenance Due  Topic Date Due    Hepatitis C Screening  Never done   Zoster Vaccines- Shingrix (1 of 2) Never done   COVID-19 Vaccine (4 - 2023-24 season) 06/01/2022   DTaP/Tdap/Td (3 - Td or Tdap) 10/20/2022    Colorectal cancer screening: Type of screening: Colonoscopy. Completed 05/05/2019. Repeat every 5 years  Mammogram status: Ordered 11/13/2022. Pt provided with contact info and advised to call to schedule appt.   Bone Density status: Completed 01/24/2015. Results reflect: Bone density results: OSTEOPOROSIS. Repeat every 2 years.She takes cholecalciferol vitamin D3 1000 units daily.  We discussed getting a new another bone marrow density to see whether the osteoporosis is stable or progressing, but she would like to defer the scan to next year.  Discussed the risks and benefits of getting a bone marrow density scan.   Lung Cancer Screening: (Low Dose CT Chest recommended if Age 74-80years, 30 pack-year currently smoking OR have quit w/in 15years.) does not qualify.     Additional Screening:  Hepatitis C Screening: does qualify.  Vision Screening: Recommended annual ophthalmology exams for early detection of glaucoma and other disorders of the eye. Is the patient up to date with their annual eye exam?  Yes  Who is the provider or what is the name of the [facility] which the patient [facility]ends [facility] exams[facility] provider, would they like to be referred to a provider to establish care?  Established .   Dental Screening: Recommended annual dental exams for proper oral hygiene  Community Resource Referral / Chronic Care Management: CRR required this visit?  No   CCM required this visit?  No      Plan:     I have personally reviewed and noted the following in the patient's chart:   Medical and social history Use of alcohol, tobacco or illicit drugs  Current medications and supplements including opioid prescriptions. Patient is not currently taking opioid  prescriptions. Functional ability and status Nutritional status Physical activity Advanced directives List of other physicians Hospitalizations, surgeries, and ER visits in previous 12 months Vitals Screenings to include cognitive, depression, and falls Referrals and appointments  In addition, I have reviewed and discussed with patient certain preventive protocols, quality metrics, and best practice recommendations. A written personalized care plan for preventive services as well as general preventive health recommendations were provided to patient.   Follow-up in 3 months for fasting blood work and hyperlipidemia.  MVelva Harman PA   11/13/2022   Nurse Notes: Face to Face 20 min

## 2022-11-15 ENCOUNTER — Telehealth: Payer: Self-pay

## 2022-11-15 LAB — TSH+FREE T4
Free T4: 0.96 ng/dL (ref 0.82–1.77)
TSH: 3.16 u[IU]/mL (ref 0.450–4.500)

## 2022-11-15 NOTE — Telephone Encounter (Signed)
-----   Message from Velva Harman, [place] sent at 11/15/2022  7:36 AM EST ----- Thyroid levels were within normal limits, so it looks like the high TSH from 9 days ago might have been a transient elevation.  We can recheck levels the next time we get labs at her follow-up, but for now we do not need to do anything for her thyroid.

## 2022-11-15 NOTE — Telephone Encounter (Signed)
Called patient to advise her the result note per Lilia Pro states: Thyroid levels were within normal limits, so it looks like the high TSH from 9 days ago might have been a transient elevation. We can recheck levels the next time we get labs at her follow-up, but for now we do not need to do anything for her thyroid.   Pt understood and agreed to continue treatment and repeat labs at next visit

## 2023-01-30 ENCOUNTER — Other Ambulatory Visit: Payer: Self-pay

## 2023-01-30 DIAGNOSIS — E785 Hyperlipidemia, unspecified: Secondary | ICD-10-CM

## 2023-01-30 DIAGNOSIS — R7989 Other specified abnormal findings of blood chemistry: Secondary | ICD-10-CM

## 2023-01-30 DIAGNOSIS — Z0000 Encounter for general adult medical examination without abnormal findings: Secondary | ICD-10-CM

## 2023-02-05 ENCOUNTER — Other Ambulatory Visit: Payer: Medicare Other

## 2023-02-05 DIAGNOSIS — Z0000 Encounter for general adult medical examination without abnormal findings: Secondary | ICD-10-CM

## 2023-02-05 DIAGNOSIS — R7989 Other specified abnormal findings of blood chemistry: Secondary | ICD-10-CM

## 2023-02-05 DIAGNOSIS — E785 Hyperlipidemia, unspecified: Secondary | ICD-10-CM | POA: Diagnosis not present

## 2023-02-05 DIAGNOSIS — E7849 Other hyperlipidemia: Secondary | ICD-10-CM

## 2023-02-06 LAB — CBC WITH DIFFERENTIAL/PLATELET
Basophils Absolute: 0 10*3/uL (ref 0.0–0.2)
Basos: 1 %
EOS (ABSOLUTE): 0.2 10*3/uL (ref 0.0–0.4)
Eos: 5 %
Hematocrit: 39.4 % (ref 34.0–46.6)
Hemoglobin: 13.3 g/dL (ref 11.1–15.9)
Immature Grans (Abs): 0 10*3/uL (ref 0.0–0.1)
Immature Granulocytes: 0 %
Lymphocytes Absolute: 1.4 10*3/uL (ref 0.7–3.1)
Lymphs: 39 %
MCH: 31.2 pg (ref 26.6–33.0)
MCHC: 33.8 g/dL (ref 31.5–35.7)
MCV: 93 fL (ref 79–97)
Monocytes Absolute: 0.4 10*3/uL (ref 0.1–0.9)
Monocytes: 11 %
Neutrophils Absolute: 1.6 10*3/uL (ref 1.4–7.0)
Neutrophils: 44 %
Platelets: 267 10*3/uL (ref 150–450)
RBC: 4.26 x10E6/uL (ref 3.77–5.28)
RDW: 13 % (ref 11.7–15.4)
WBC: 3.5 10*3/uL (ref 3.4–10.8)

## 2023-02-06 LAB — COMPREHENSIVE METABOLIC PANEL
ALT: 22 IU/L (ref 0–32)
AST: 23 IU/L (ref 0–40)
Albumin/Globulin Ratio: 1.8 (ref 1.2–2.2)
Albumin: 4.2 g/dL (ref 3.8–4.8)
Alkaline Phosphatase: 86 IU/L (ref 44–121)
BUN/Creatinine Ratio: 21 (ref 12–28)
BUN: 14 mg/dL (ref 8–27)
Bilirubin Total: 0.3 mg/dL (ref 0.0–1.2)
CO2: 25 mmol/L (ref 20–29)
Calcium: 9.1 mg/dL (ref 8.7–10.3)
Chloride: 100 mmol/L (ref 96–106)
Creatinine, Ser: 0.68 mg/dL (ref 0.57–1.00)
Globulin, Total: 2.3 g/dL (ref 1.5–4.5)
Glucose: 100 mg/dL — ABNORMAL HIGH (ref 70–99)
Potassium: 4.4 mmol/L (ref 3.5–5.2)
Sodium: 138 mmol/L (ref 134–144)
Total Protein: 6.5 g/dL (ref 6.0–8.5)
eGFR: 91 mL/min/{1.73_m2} (ref >59)

## 2023-02-06 LAB — LIPID PANEL
Chol/HDL Ratio: 3.8 ratio (ref 0.0–4.4)
Cholesterol, Total: 309 mg/dL — ABNORMAL HIGH (ref 100–199)
HDL: 81 mg/dL (ref >39)
LDL Chol Calc (NIH): 208 mg/dL — ABNORMAL HIGH (ref 0–99)
Triglycerides: 116 mg/dL (ref 0–149)
VLDL Cholesterol Cal: 20 mg/dL (ref 5–40)

## 2023-02-06 LAB — HEMOGLOBIN A1C
Est. average glucose Bld gHb Est-mCnc: 111 mg/dL
Hgb A1c MFr Bld: 5.5 % (ref 4.8–5.6)

## 2023-02-06 LAB — TSH: TSH: 6.52 u[IU]/mL — ABNORMAL HIGH (ref 0.450–4.500)

## 2023-02-12 ENCOUNTER — Ambulatory Visit: Payer: Medicare Other | Admitting: Family Medicine

## 2023-02-20 ENCOUNTER — Encounter: Payer: Self-pay | Admitting: Family Medicine

## 2023-02-20 ENCOUNTER — Ambulatory Visit (INDEPENDENT_AMBULATORY_CARE_PROVIDER_SITE_OTHER): Payer: Medicare Other | Admitting: Family Medicine

## 2023-02-20 VITALS — BP 110/71 | HR 77 | Resp 18 | Ht 64.0 in | Wt 135.0 lb

## 2023-02-20 DIAGNOSIS — L409 Psoriasis, unspecified: Secondary | ICD-10-CM

## 2023-02-20 DIAGNOSIS — E7800 Pure hypercholesterolemia, unspecified: Secondary | ICD-10-CM | POA: Diagnosis not present

## 2023-02-20 DIAGNOSIS — R946 Abnormal results of thyroid function studies: Secondary | ICD-10-CM

## 2023-02-20 MED ORDER — ROSUVASTATIN CALCIUM 5 MG PO TABS
5.0000 mg | ORAL_TABLET | Freq: Every day | ORAL | 1 refills | Status: DC
Start: 2023-02-20 — End: 2023-05-14

## 2023-02-20 MED ORDER — BETAMETHASONE DIPROPIONATE 0.05 % EX CREA
TOPICAL_CREAM | Freq: Two times a day (BID) | CUTANEOUS | 0 refills | Status: AC
Start: 2023-02-20 — End: ?

## 2023-02-20 NOTE — Assessment & Plan Note (Signed)
Patient with history of psoriasis, she does not have her appointment with dermatology for another 2 months.  Requested refill of betamethasone 0.05% cream to get her through that appointment.  Provided refill, recommended to continue follow-up with dermatology.

## 2023-02-20 NOTE — Progress Notes (Signed)
   Established Patient Office Visit  Subjective   Patient ID: Kerri Taylor, female    DOB: 03-30-1951  Age: 72 y.o. MRN: [id number]  Chief Complaint  Patient presents with   Hyperlipidemia   Psoriasis         HPI Kerri Taylor is a 72 y.o. female presenting today for follow up of hyperlipidemia. Hyperlipidemia: Previously developed myalgias while taking atorvastatin and took herself off of them prior to last appointment 3 months ago.  She has been managing with low-fat diet, co-Q10 supplement, red yeast rice supplement.. Currently consuming a low fat diet and exercising regularly. The 10-year ASCVD risk score (Arnett DK, et al., 2019) is: 11.5%  ROS Negative unless otherwise noted in HPI   Objective:     BP 110/71   Pulse 77   Resp 18   Ht 5\' 4"  (1.626 m)   Wt 135 lb (61.2 kg)   SpO2 94%   BMI 23.17 kg/m   Physical Exam Constitutional:      General: She is not in acute distress.    Appearance: Normal appearance.  HENT:     Head: Normocephalic and atraumatic.  Cardiovascular:     Rate and Rhythm: Normal rate and regular rhythm.     Heart sounds: No murmur heard.    No friction rub. No gallop.  Pulmonary:     Effort: Pulmonary effort is normal. No respiratory distress.     Breath sounds: No wheezing, rhonchi or rales.  Skin:    General: Skin is warm and dry.     Coloration: Skin is not jaundiced.     Findings: Rash (psoriatic rash on right heel and top of foot) present. No bruising or erythema.  Neurological:     Mental Status: She is alert and oriented to person, place, and time.     Assessment & Plan:  Pure hypercholesterolemia Assessment & Plan: Last lipid panel: LDL 208, HDL 81, triglycerides 116.  Lifestyle changes, co-Q10, red yeast rice alone have not been sufficient to keep LDL levels at a low risk level.  It is likely that there is a familial/genetic component contributing to such high LDL levels wall following a low-fat diet and getting consistent  exercise.  Previously tried and failed atorvastatin 20 mg with myalgias, but is agreeable to rosuvastatin 5 mg daily.  Follow-up in 2 months to assess cholesterol levels and will adjust management as necessary.  Orders: -     Rosuvastatin Calcium; Take 1 tablet (5 mg total) by mouth daily.  Dispense: 90 tablet; Refill: 1 -     Lipid panel; Future  Psoriasis of scalp Assessment & Plan: Patient with history of psoriasis, she does not have her appointment with dermatology for another 2 months.  Requested refill of betamethasone 0.05% cream to get her through that appointment.  Provided refill, recommended to continue follow-up with dermatology.  Orders: -     Betamethasone Dipropionate; Apply topically 2 (two) times daily.  Dispense: 30 g; Refill: 0  Abnormal thyroid function test -     T4, free; Future -     TSH; Future  Patient has had patterns with elevated TSH but normal T4 and normal TSH.  Repeating TSH and T4 at appointment in about 2 months.  Return in about 2 months (around 04/22/2023) for follow-up for cholesterol, fasting blood work 1 week before.    Melida Quitter, PA

## 2023-02-20 NOTE — Assessment & Plan Note (Signed)
Last lipid panel: LDL 208, HDL 81, triglycerides 116.  Lifestyle changes, co-Q10, red yeast rice alone have not been sufficient to keep LDL levels at a low risk level.  It is likely that there is a familial/genetic component contributing to such high LDL levels wall following a low-fat diet and getting consistent exercise.  Previously tried and failed atorvastatin 20 mg with myalgias, but is agreeable to rosuvastatin 5 mg daily.  Follow-up in 2 months to assess cholesterol levels and will adjust management as necessary.

## 2023-04-09 DIAGNOSIS — D2361 Other benign neoplasm of skin of right upper limb, including shoulder: Secondary | ICD-10-CM | POA: Diagnosis not present

## 2023-04-09 DIAGNOSIS — Z85820 Personal history of malignant melanoma of skin: Secondary | ICD-10-CM | POA: Diagnosis not present

## 2023-04-09 DIAGNOSIS — L918 Other hypertrophic disorders of the skin: Secondary | ICD-10-CM | POA: Diagnosis not present

## 2023-04-09 DIAGNOSIS — D2271 Melanocytic nevi of right lower limb, including hip: Secondary | ICD-10-CM | POA: Diagnosis not present

## 2023-04-09 DIAGNOSIS — D2371 Other benign neoplasm of skin of right lower limb, including hip: Secondary | ICD-10-CM | POA: Diagnosis not present

## 2023-04-09 DIAGNOSIS — L814 Other melanin hyperpigmentation: Secondary | ICD-10-CM | POA: Diagnosis not present

## 2023-04-09 DIAGNOSIS — L400 Psoriasis vulgaris: Secondary | ICD-10-CM | POA: Diagnosis not present

## 2023-04-09 DIAGNOSIS — L821 Other seborrheic keratosis: Secondary | ICD-10-CM | POA: Diagnosis not present

## 2023-04-09 DIAGNOSIS — D225 Melanocytic nevi of trunk: Secondary | ICD-10-CM | POA: Diagnosis not present

## 2023-04-09 DIAGNOSIS — L820 Inflamed seborrheic keratosis: Secondary | ICD-10-CM | POA: Diagnosis not present

## 2023-04-09 DIAGNOSIS — L570 Actinic keratosis: Secondary | ICD-10-CM | POA: Diagnosis not present

## 2023-04-16 ENCOUNTER — Other Ambulatory Visit: Payer: Medicare Other

## 2023-04-16 DIAGNOSIS — E7800 Pure hypercholesterolemia, unspecified: Secondary | ICD-10-CM | POA: Diagnosis not present

## 2023-04-16 DIAGNOSIS — R946 Abnormal results of thyroid function studies: Secondary | ICD-10-CM

## 2023-04-17 LAB — LIPID PANEL
Chol/HDL Ratio: 3.1 ratio (ref 0.0–4.4)
Cholesterol, Total: 250 mg/dL — ABNORMAL HIGH (ref 100–199)
HDL: 81 mg/dL (ref >39)
LDL Chol Calc (NIH): 149 mg/dL — ABNORMAL HIGH (ref 0–99)
Triglycerides: 118 mg/dL (ref 0–149)
VLDL Cholesterol Cal: 20 mg/dL (ref 5–40)

## 2023-04-17 LAB — TSH: TSH: 4.99 u[IU]/mL — ABNORMAL HIGH (ref 0.450–4.500)

## 2023-04-17 LAB — T4, FREE: Free T4: 0.99 ng/dL (ref 0.82–1.77)

## 2023-04-22 ENCOUNTER — Ambulatory Visit: Payer: Medicare Other | Admitting: Family Medicine

## 2023-04-22 NOTE — Progress Notes (Deleted)
   Established Patient Office Visit  Subjective   Patient ID: ANGELIQUE CHEVALIER, female    DOB: 12-Jul-1951  Age: 72 y.o. MRN: [id number]  No chief complaint on file.   HPI YAMILEE HARMES is a 72 y.o. female presenting today for follow up of hyperlipidemia.  ROS Negative unless otherwise noted in HPI   Objective:     There were no vitals taken for this visit.  Physical Exam   Assessment & Plan:  Other hyperlipidemia  Reviewed most recent labs. LDL improved to 149 from 208. TSH remains elevated at 4.990, free T4 still within normal limits.  No follow-ups on file.    Melida Quitter, PA

## 2023-05-09 ENCOUNTER — Telehealth: Payer: Self-pay

## 2023-05-09 DIAGNOSIS — F411 Generalized anxiety disorder: Secondary | ICD-10-CM

## 2023-05-09 MED ORDER — CITALOPRAM HYDROBROMIDE 40 MG PO TABS
40.0000 mg | ORAL_TABLET | Freq: Every day | ORAL | 1 refills | Status: DC
Start: 2023-05-09 — End: 2023-11-07

## 2023-05-09 NOTE — Telephone Encounter (Signed)
Prescription Request  05/09/2023  LOV: 02/20/23 ROV: 07/04/23  What is the name of the medication or equipment? citalopram (CELEXA) 40 MG tablet   Have you contacted your pharmacy to request a refill? Yes   Which pharmacy would you like this sent to?  [id number] [place], [place] - [address] [place] [address] Phone: [phone] Fax: [phone]   Patient notified that their request is being sent to the clinical staff for review and that they should receive a response within 2 business days.   Please advise at Mobile [phone] (mobile)

## 2023-05-09 NOTE — Telephone Encounter (Signed)
Refill sent.

## 2023-05-14 ENCOUNTER — Other Ambulatory Visit: Payer: Self-pay | Admitting: Family Medicine

## 2023-05-14 DIAGNOSIS — E7800 Pure hypercholesterolemia, unspecified: Secondary | ICD-10-CM

## 2023-06-06 DIAGNOSIS — L400 Psoriasis vulgaris: Secondary | ICD-10-CM | POA: Diagnosis not present

## 2023-06-06 DIAGNOSIS — D692 Other nonthrombocytopenic purpura: Secondary | ICD-10-CM | POA: Diagnosis not present

## 2023-06-06 DIAGNOSIS — Z85820 Personal history of malignant melanoma of skin: Secondary | ICD-10-CM | POA: Diagnosis not present

## 2023-06-06 DIAGNOSIS — L438 Other lichen planus: Secondary | ICD-10-CM | POA: Diagnosis not present

## 2023-06-06 DIAGNOSIS — L820 Inflamed seborrheic keratosis: Secondary | ICD-10-CM | POA: Diagnosis not present

## 2023-07-04 ENCOUNTER — Encounter: Payer: Self-pay | Admitting: Family Medicine

## 2023-07-04 ENCOUNTER — Ambulatory Visit: Payer: Medicare Other | Admitting: Family Medicine

## 2023-07-04 VITALS — BP 134/83 | HR 72 | Resp 18 | Ht 64.0 in | Wt 136.0 lb

## 2023-07-04 DIAGNOSIS — Z23 Encounter for immunization: Secondary | ICD-10-CM | POA: Diagnosis not present

## 2023-07-04 DIAGNOSIS — E559 Vitamin D deficiency, unspecified: Secondary | ICD-10-CM

## 2023-07-04 DIAGNOSIS — Z1159 Encounter for screening for other viral diseases: Secondary | ICD-10-CM | POA: Diagnosis not present

## 2023-07-04 DIAGNOSIS — R7989 Other specified abnormal findings of blood chemistry: Secondary | ICD-10-CM | POA: Diagnosis not present

## 2023-07-04 DIAGNOSIS — E7800 Pure hypercholesterolemia, unspecified: Secondary | ICD-10-CM

## 2023-07-04 DIAGNOSIS — Z131 Encounter for screening for diabetes mellitus: Secondary | ICD-10-CM

## 2023-07-04 MED ORDER — ROSUVASTATIN CALCIUM 5 MG PO TABS: 5.0000 mg | ORAL_TABLET | Freq: Every day | ORAL | 0 refills | Status: DC

## 2023-07-04 NOTE — Progress Notes (Signed)
   Established Patient Office Visit  Subjective   Patient ID: Kerri Taylor, female    DOB: [birth year]  Age: 72 y.o. MRN: [id number]  Chief Complaint  Patient presents with   Anxiety   Hyperlipidemia    HPI Kerri Taylor is a 72 y.o. female presenting today for follow up of hyperlipidemia.  Labs were completed 04/16/2023.  She is tolerating rosuvastatin 5 mg daily well with no myalgias or significant side effects. Currently consuming a low fat diet and exercising regularly. The 10-year ASCVD risk score (Arnett DK, et al., 2019) is: 16%  Outpatient Medications Prior to Visit  Medication Sig   betamethasone dipropionate 0.05 % cream Apply topically 2 (two) times daily.   cholecalciferol (VITAMIN D3) 25 MCG (1000 UT) tablet Take 1,000 Units by mouth daily.   citalopram (CELEXA) 40 MG tablet Take 1 tablet (40 mg total) by mouth daily.   cyclobenzaprine (FLEXERIL) 10 MG tablet Take 1 tablet (10 mg total) by mouth at bedtime as needed for muscle spasms.   triamcinolone (KENALOG) 0.025 % ointment Apply topically 2 (two) times daily.   [DISCONTINUED] rosuvastatin (CRESTOR) 5 MG tablet TAKE 1 TABLET (5 MG TOTAL) BY MOUTH DAILY.   No facility-administered medications prior to visit.    ROS Negative unless otherwise noted in HPI   Objective:     BP 134/83 (BP Location: Left Arm, Patient Position: Sitting, Cuff Size: Normal)   Pulse 72   Resp 18   Ht 5\' 4"  (1.626 m)   Wt 136 lb (61.7 kg)   SpO2 98%   BMI 23.34 kg/m   Physical Exam Constitutional:      General: She is not in acute distress.    Appearance: Normal appearance.  HENT:     Head: Normocephalic and atraumatic.  Cardiovascular:     Rate and Rhythm: Normal rate and regular rhythm.     Heart sounds: No murmur heard.    No friction rub. No gallop.  Pulmonary:     Effort: Pulmonary effort is normal. No respiratory distress.     Breath sounds: No wheezing, rhonchi or rales.  Skin:    General: Skin is warm and dry.   Neurological:     Mental Status: She is alert and oriented to person, place, and time.     Assessment & Plan:  Pure hypercholesterolemia Assessment & Plan: Last lipid panel: LDL 149, HDL 81, triglycerides 118.  LDL improved significantly from 208 after starting rosuvastatin.  CMP within normal limits.  Continue rosuvastatin 5 mg daily, repeat lipid panel with annual physical labs.  Continue low-fat diet and routine physical activity.  Orders: -     Rosuvastatin Calcium; Take 1 tablet (5 mg total) by mouth daily.  Dispense: 90 tablet; Refill: 0  Elevated TSH -     TSH; Future -     T4, free; Future  Need for influenza vaccination -     Flu Vaccine Trivalent High Dose (Fluad)  TSH seems to fluctuate between elevated and within normal limits.  Repeating TSH and T4.  If TSH remains elevated, initiate levothyroxine 25 mcg daily.  We may consider referral to endocrinology as well.  She plans on calling to schedule her mammogram, she is aware that she is due for one.  Return in about 6 months (around 01/02/2024) for annual physical, fasting blood work 1 week before.    Melida Quitter, PA

## 2023-07-04 NOTE — Assessment & Plan Note (Signed)
Last lipid panel: LDL 149, HDL 81, triglycerides 118.  LDL improved significantly from 208 after starting rosuvastatin.  CMP within normal limits.  Continue rosuvastatin 5 mg daily, repeat lipid panel with annual physical labs.  Continue low-fat diet and routine physical activity.

## 2023-07-04 NOTE — Patient Instructions (Addendum)
We will check your thyroid levels again, and I will get in touch with you once we have the results.  Try to get your mammogram done before I see you in about 6 months!

## 2023-07-16 ENCOUNTER — Other Ambulatory Visit: Payer: Medicare Other

## 2023-07-23 ENCOUNTER — Other Ambulatory Visit: Payer: Medicare Other

## 2023-07-23 DIAGNOSIS — E559 Vitamin D deficiency, unspecified: Secondary | ICD-10-CM

## 2023-07-23 DIAGNOSIS — Z131 Encounter for screening for diabetes mellitus: Secondary | ICD-10-CM

## 2023-07-23 DIAGNOSIS — E7800 Pure hypercholesterolemia, unspecified: Secondary | ICD-10-CM | POA: Diagnosis not present

## 2023-07-23 DIAGNOSIS — R7989 Other specified abnormal findings of blood chemistry: Secondary | ICD-10-CM | POA: Diagnosis not present

## 2023-07-23 DIAGNOSIS — Z1159 Encounter for screening for other viral diseases: Secondary | ICD-10-CM

## 2023-07-24 LAB — COMPREHENSIVE METABOLIC PANEL
ALT: 22 [IU]/L (ref 0–32)
AST: 23 [IU]/L (ref 0–40)
Albumin: 4.3 g/dL (ref 3.8–4.8)
Alkaline Phosphatase: 98 [IU]/L (ref 44–121)
BUN/Creatinine Ratio: 17 (ref 12–28)
BUN: 12 mg/dL (ref 8–27)
Bilirubin Total: 0.4 mg/dL (ref 0.0–1.2)
CO2: 25 mmol/L (ref 20–29)
Calcium: 9 mg/dL (ref 8.7–10.3)
Chloride: 99 mmol/L (ref 96–106)
Creatinine, Ser: 0.71 mg/dL (ref 0.57–1.00)
Globulin, Total: 2.5 g/dL (ref 1.5–4.5)
Glucose: 96 mg/dL (ref 70–99)
Potassium: 4.5 mmol/L (ref 3.5–5.2)
Sodium: 137 mmol/L (ref 134–144)
Total Protein: 6.8 g/dL (ref 6.0–8.5)
eGFR: 89 mL/min/{1.73_m2} (ref >59)

## 2023-07-24 LAB — LIPID PANEL
Chol/HDL Ratio: 3.7 ratio (ref 0.0–4.4)
Cholesterol, Total: 267 mg/dL — ABNORMAL HIGH (ref 100–199)
HDL: 73 mg/dL (ref >39)
LDL Chol Calc (NIH): 167 mg/dL — ABNORMAL HIGH (ref 0–99)
Triglycerides: 150 mg/dL — ABNORMAL HIGH (ref 0–149)
VLDL Cholesterol Cal: 27 mg/dL (ref 5–40)

## 2023-07-24 LAB — TSH RFX ON ABNORMAL TO FREE T4: TSH: 5.98 u[IU]/mL — ABNORMAL HIGH (ref 0.450–4.500)

## 2023-07-24 LAB — CBC WITH DIFFERENTIAL/PLATELET
Basophils Absolute: 0 10*3/uL (ref 0.0–0.2)
Basos: 1 %
EOS (ABSOLUTE): 0.2 10*3/uL (ref 0.0–0.4)
Eos: 5 %
Hematocrit: 42.6 % (ref 34.0–46.6)
Hemoglobin: 14.3 g/dL (ref 11.1–15.9)
Immature Grans (Abs): 0 10*3/uL (ref 0.0–0.1)
Immature Granulocytes: 1 %
Lymphocytes Absolute: 1.2 10*3/uL (ref 0.7–3.1)
Lymphs: 34 %
MCH: 31.8 pg (ref 26.6–33.0)
MCHC: 33.6 g/dL (ref 31.5–35.7)
MCV: 95 fL (ref 79–97)
Monocytes Absolute: 0.4 10*3/uL (ref 0.1–0.9)
Monocytes: 11 %
Neutrophils Absolute: 1.8 10*3/uL (ref 1.4–7.0)
Neutrophils: 48 %
Platelets: 330 10*3/uL (ref 150–450)
RBC: 4.49 x10E6/uL (ref 3.77–5.28)
RDW: 12.5 % (ref 11.7–15.4)
WBC: 3.7 10*3/uL (ref 3.4–10.8)

## 2023-07-24 LAB — VITAMIN D 25 HYDROXY (VIT D DEFICIENCY, FRACTURES): Vit D, 25-Hydroxy: 26.1 ng/mL — ABNORMAL LOW (ref 30.0–100.0)

## 2023-07-24 LAB — T4, FREE: Free T4: 1.1 ng/dL (ref 0.82–1.77)

## 2023-07-24 LAB — HEMOGLOBIN A1C
Est. average glucose Bld gHb Est-mCnc: 117 mg/dL
Hgb A1c MFr Bld: 5.7 % — ABNORMAL HIGH (ref 4.8–5.6)

## 2023-07-24 LAB — TSH

## 2023-07-24 LAB — HEPATITIS C ANTIBODY: Hep C Virus Ab: NONREACTIVE

## 2023-07-24 LAB — T4F

## 2023-07-25 ENCOUNTER — Other Ambulatory Visit: Payer: Self-pay | Admitting: Family Medicine

## 2023-07-25 DIAGNOSIS — E7800 Pure hypercholesterolemia, unspecified: Secondary | ICD-10-CM

## 2023-07-25 DIAGNOSIS — E038 Other specified hypothyroidism: Secondary | ICD-10-CM

## 2023-07-25 DIAGNOSIS — E559 Vitamin D deficiency, unspecified: Secondary | ICD-10-CM

## 2023-07-25 MED ORDER — VITAMIN D (ERGOCALCIFEROL) 1.25 MG (50000 UNIT) PO CAPS: 50000.0000 [IU] | ORAL_CAPSULE | ORAL | 0 refills | Status: DC

## 2023-07-25 MED ORDER — LEVOTHYROXINE SODIUM 25 MCG PO TABS: 25.0000 ug | ORAL_TABLET | Freq: Every day | ORAL | 3 refills | Status: DC

## 2023-08-06 ENCOUNTER — Encounter: Payer: Self-pay | Admitting: Family Medicine

## 2023-08-27 ENCOUNTER — Other Ambulatory Visit: Payer: Self-pay | Admitting: Family Medicine

## 2023-08-27 DIAGNOSIS — E7800 Pure hypercholesterolemia, unspecified: Secondary | ICD-10-CM

## 2023-08-27 NOTE — Telephone Encounter (Signed)
Copied from CRM [id number]. Topic: Clinical - Medication Refill >> Aug 27, 2023 11:07 AM Lorin Glass B wrote: Most Recent Primary Care Visit:  Provider: [facility]  Department: [facility]  Visit Type: LAB  Date: 07/23/2023  Medication: rosuvastatin (CRESTOR) 5 MG tablet  Has the patient contacted their pharmacy? No, patient said bottle directly says no refills (Agent: If no, request that the patient contact the pharmacy for the refill. If patient does not wish to contact the pharmacy document the reason why and proceed with request.) (Agent: If yes, when and what did the pharmacy advise?)  Is this the correct pharmacy for this prescription? Yes If no, delete pharmacy and type the correct one.   This is the patient's preferred pharmacy:   [facility] [address] [place] - [address] [place] [address] Phone: [phone] Fax: [phone]   Has the prescription been filled recently?   Is the patient out of the medication?   Has the patient been seen for an appointment in the last year OR does the patient have an upcoming appointment?   Can we respond through MyChart?   Agent: Please be advised that Rx refills may take up to 3 business days. We ask that you follow-up with your pharmacy.

## 2023-08-28 NOTE — Progress Notes (Signed)
Pharmacy Quality Measure Review  This patient is appearing on a report for being at risk of failing the adherence measure for cholesterol (statin) medications this calendar year.   Medication: rosuvastatin 5mg  Last fill date: 05/14/2023 for 90 day supply  Contacted patient, and she is traveling for Thanksgiving but plans to pick medication up Saturday, 11/30.  Lenna Gilford, PharmD, DPLA

## 2023-09-03 ENCOUNTER — Other Ambulatory Visit: Payer: Medicare Other

## 2023-09-03 DIAGNOSIS — E038 Other specified hypothyroidism: Secondary | ICD-10-CM | POA: Diagnosis not present

## 2023-09-04 LAB — TSH: TSH: 3 u[IU]/mL (ref 0.450–4.500)

## 2023-09-04 LAB — T4, FREE: Free T4: 1.04 ng/dL (ref 0.82–1.77)

## 2023-10-07 ENCOUNTER — Other Ambulatory Visit: Payer: Medicare Other

## 2023-11-07 ENCOUNTER — Other Ambulatory Visit: Payer: Self-pay | Admitting: Family Medicine

## 2023-11-07 ENCOUNTER — Encounter: Payer: Self-pay | Admitting: Family Medicine

## 2023-11-07 DIAGNOSIS — F411 Generalized anxiety disorder: Secondary | ICD-10-CM

## 2023-11-21 ENCOUNTER — Other Ambulatory Visit: Payer: Self-pay | Admitting: Family Medicine

## 2023-11-21 DIAGNOSIS — E559 Vitamin D deficiency, unspecified: Secondary | ICD-10-CM

## 2023-11-22 NOTE — Telephone Encounter (Signed)
Prescription strength vitamin D no longer needed.  Instead, she can switch to an over-the-counter vitamin D3 2000 unit daily supplement.

## 2023-11-25 DIAGNOSIS — K080 Exfoliation of teeth due to systemic causes: Secondary | ICD-10-CM | POA: Diagnosis not present

## 2023-12-02 ENCOUNTER — Ambulatory Visit (INDEPENDENT_AMBULATORY_CARE_PROVIDER_SITE_OTHER)

## 2023-12-02 ENCOUNTER — Encounter: Payer: Self-pay | Admitting: *Deleted

## 2023-12-02 ENCOUNTER — Other Ambulatory Visit: Payer: Self-pay

## 2023-12-02 ENCOUNTER — Ambulatory Visit: Admission: EM | Admit: 2023-12-02 | Discharge: 2023-12-02 | Disposition: A | Discharge status: Home / Self Care

## 2023-12-02 DIAGNOSIS — R058 Other specified cough: Secondary | ICD-10-CM | POA: Diagnosis not present

## 2023-12-02 DIAGNOSIS — J22 Unspecified acute lower respiratory infection: Secondary | ICD-10-CM

## 2023-12-02 DIAGNOSIS — G8929 Other chronic pain: Secondary | ICD-10-CM | POA: Diagnosis not present

## 2023-12-02 DIAGNOSIS — M25519 Pain in unspecified shoulder: Secondary | ICD-10-CM

## 2023-12-02 DIAGNOSIS — I700 Atherosclerosis of aorta: Secondary | ICD-10-CM | POA: Diagnosis not present

## 2023-12-02 HISTORY — DX: Psoriasis, unspecified: L40.9

## 2023-12-02 MED ORDER — AZITHROMYCIN 250 MG PO TABS: ORAL_TABLET | ORAL | 0 refills | Status: DC

## 2023-12-02 MED ORDER — BENZONATATE 200 MG PO CAPS: 200.0000 mg | ORAL_CAPSULE | Freq: Three times a day (TID) | ORAL | 0 refills | Status: DC | PRN

## 2023-12-02 MED ORDER — CYCLOBENZAPRINE HCL 10 MG PO TABS: 10.0000 mg | ORAL_TABLET | Freq: Every evening | ORAL | 0 refills | Status: DC | PRN

## 2023-12-02 MED ORDER — PREDNISONE 20 MG PO TABS: 20.0000 mg | ORAL_TABLET | Freq: Every day | ORAL | 0 refills | Status: AC

## 2023-12-02 NOTE — ED Triage Notes (Signed)
 Cough and congestion since Wednesday. No fever. Would like a refill of flexeril if possible

## 2023-12-02 NOTE — ED Provider Notes (Signed)
 [facility]    CSN: [id number] Arrival date & time: 12/02/23  1343      History   Chief Complaint Chief Complaint  Patient presents with   Cough   Nasal Congestion    HPI Kerri Taylor is a 73 y.o. female.  Patient presents today with cough and nasal congestion ongoing for 5 days. No fever. No know sick contacts.  Is a non-smoker and has no history of reactive airway disease or chronic bronchitis. Endorse pain in the chest with cough. She has not had chills, fever, or weakness. She has taken OTC medication without improvement of symptoms. 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      Home Medications    Prior to Admission medications   Medication Sig Start Date End Date Taking? Authorizing Provider  azithromycin (ZITHROMAX) 250 MG tablet Take 2 tabs PO x 1 dose, then 1 tab PO QD x 4 days 12/02/23  Yes Bing Neighbors, NP  benzonatate (TESSALON) 200 MG capsule Take 1 capsule (200 mg total) by mouth 3 (three) times daily as needed for cough. 12/02/23  Yes Bing Neighbors, NP  betamethasone dipropionate 0.05 % cream  Apply topically 2 (two) times daily. 02/20/23  Yes Saralyn Pilar A, PA  cholecalciferol (VITAMIN D3) 25 MCG (1000 UT) tablet Take 1,000 Units by mouth daily.   Yes [provider]  citalopram (CELEXA) 40 MG tablet TAKE 1 TABLET BY MOUTH EVERY DAY 11/07/23  Yes Saralyn Pilar A, PA  clobetasol cream (TEMOVATE) 0.05 % Apply 1 Application topically 2 (two) times daily. 10/17/23  Yes [provider]  levothyroxine (SYNTHROID) 25 MCG tablet Take 1 tablet (25 mcg total) by mouth daily. 07/25/23  Yes Saralyn Pilar A, PA  predniSONE (DELTASONE) 20 MG tablet Take 1 tablet (20 mg total) by mouth daily with breakfast for 5 days. 12/02/23 12/07/23 Yes Bing Neighbors, NP  rosuvastatin (CRESTOR) 5 MG tablet Take 1 tablet (5 mg total) by mouth daily. 08/11/23  Yes Saralyn Pilar A, PA  triamcinolone (KENALOG) 0.025 % ointment Apply topically 2 (two) times daily. 07/30/17  Yes Roderick Pee, MD  Vitamin D, Ergocalciferol, (DRISDOL) 1.25 MG (50000 UNIT) CAPS capsule Take 1 capsule (50,000 Units total) by mouth every 7 (seven) days. 07/25/23  Yes Saralyn Pilar A, PA  cyclobenzaprine (FLEXERIL) 10 MG tablet Take 1 tablet (10 mg total) by mouth at bedtime as needed for muscle spasms. 12/02/23   Bing Neighbors, NP    Family History Family History  Problem Relation Age of Onset   Colon cancer Brother 40   Aneurysm Mother    Healthy Sister    Suicidality Sister    Healthy Brother    Healthy Brother    Healthy Brother    Esophageal cancer Neg Hx    Rectal cancer Neg Hx    Stomach cancer Neg Hx     Social History Social History   Tobacco Use   Smoking status: Never    Passive exposure: Never   Smokeless tobacco: Never  Vaping Use   Vaping status: Never Used  Substance Use Topics   Alcohol use: Yes    Alcohol/week: 3.0 standard drinks of alcohol    Types: 3 Cans of beer per week    Comment: occasional   Drug use: No     Allergies   Codeine   Review of Systems Review  of Systems  Respiratory:  Positive for cough.     Physical Exam Triage Vital Signs ED Triage Vitals  Encounter Vitals Group     BP 12/02/23 1452 111/79     Systolic BP Percentile --      Diastolic BP Percentile --      Pulse Rate 12/02/23 1452 83     Resp 12/02/23 1452 16     Temp 12/02/23 1452 97.9 F (36.6 C)     Temp Source 12/02/23 1452 Oral     SpO2 12/02/23 1452 97 %     Weight --      Height --      Head Circumference --      Peak Flow --      Pain Score 12/02/23 1448 0     Pain Loc --      Pain Education --      Exclude from Growth Chart --    No data found.  Updated Vital Signs BP 111/79 (BP Location: Left Arm)   Pulse 83   Temp 97.9 F (36.6 C) (Oral)   Resp 16   SpO2 97%   Visual Acuity Right Eye Distance:   Left Eye Distance:   Bilateral Distance:    Right Eye Near:   Left Eye Near:    Bilateral Near:     Physical Exam Constitutional:      Appearance: Normal appearance.  HENT:     Head: Normocephalic and atraumatic.     Nose: Congestion present. No rhinorrhea.  Eyes:     Extraocular Movements: Extraocular movements intact.     Conjunctiva/sclera: Conjunctivae normal.     Pupils: Pupils are equal, round, and reactive to light.  Cardiovascular:     Rate and Rhythm: Normal rate and regular rhythm.  Pulmonary:     Breath sounds: Wheezing, rhonchi and rales present.  Musculoskeletal:        General: Normal range of motion.  Skin:    General: Skin is warm and dry.  Neurological:     General: No focal deficit present.     Mental Status: She is alert and oriented to person, place, and time.      UC Treatments / Results  Labs (all labs ordered are listed, but only abnormal results are displayed) Labs Reviewed - No data to display  EKG   Radiology No results found.  Procedures Procedures (including critical care time)  Medications Ordered in UC Medications - No data to display  Initial Impression / Assessment and Plan / UC  Course  I have reviewed the triage vital signs and the nursing  notes.  Pertinent labs & imaging results that were available during my care of the patient were reviewed by me and considered in my medical decision making (see chart for details).    Treating for acute lower respiratory infection, patient has had productive cough with chest discomfort worsening over the last few days.  No fever.  Concern on exam patient had wheezing and rales obtain a chest x-ray to rule out an acute pneumonia.  No obvious pneumonia seen on initial read.  Patient advised that once radiologist reviews x-ray if anything is different we will follow-up with her via MyChart.  Treatment per discharge medication orders.  Return precautions given if symptoms worsen or do not improve.  Patient verbalized understanding and agreement with plan. Final Clinical Impressions(s) / UC Diagnoses   Final diagnoses:  Productive cough  Acute lower respiratory infection     Discharge Instructions      Take medication as prescribed.  If your symptoms worsen or do not improve return for reevaluation.  I have also refilled your Flexeril.     ED Prescriptions     Medication Sig Dispense Auth. Provider   azithromycin (ZITHROMAX) 250 MG tablet Take 2 tabs PO x 1 dose, then 1 tab PO QD x 4 days 6 tablet Bing Neighbors, NP   predniSONE (DELTASONE) 20 MG tablet Take 1 tablet (20 mg total) by mouth daily with breakfast for 5 days. 5 tablet Bing Neighbors, NP   benzonatate (TESSALON) 200 MG capsule Take 1 capsule (200 mg total) by mouth 3 (three) times daily as needed for cough. 30 capsule Bing Neighbors, NP   cyclobenzaprine (FLEXERIL) 10 MG tablet Take 1 tablet (10 mg total) by mouth at bedtime as needed for muscle spasms. 30 tablet Bing Neighbors, NP      PDMP not reviewed this encounter.   Bing Neighbors, NP 12/02/23 [phone]

## 2023-12-02 NOTE — Discharge Instructions (Signed)
 Take medication as prescribed.  If your symptoms worsen or do not improve return for reevaluation.  I have also refilled your Flexeril.

## 2023-12-05 ENCOUNTER — Ambulatory Visit: Payer: Self-pay | Admitting: Family Medicine

## 2023-12-05 NOTE — Telephone Encounter (Signed)
  Chief Complaint: Cough Symptoms: Green nasal mucus, chest congestion, cough Pertinent Negatives: Patient denies fever, SOB Disposition: [] ED /[] Urgent Care (no appt availability in office) / [x] Appointment(In office/virtual)/ []  [facility]/ [] Home Care/ [] Refused Recommended Disposition /[] [place]/ []  Follow-up with PCP Additional Notes: Patient has been having cough, nasal discharge, and chest congestion. She was seen at urgent care on 3/3 and was given prescriptions for Azithromycin and Benzonatate. She started taking them on 3/3. She stated her cough seems a little better but it gets worse at night. Congestion is a little better. Patient stated there is no improvement at all with her nasal discharge, and it is now green. Patient wants another prescription to be sent. There are no appts at PCP office. Offered to schedule appointment tomorrow at different office and patient declined. She stated she will not return to urgent care either. Patient is aware that a message will be sent to the office.   Reason for Disposition  [1] Nasal discharge AND [2] present > 10 days  Answer Assessment - Initial Assessment Questions 1. ONSET: "When did the cough begin?"      Was seen at Urgent Care on 3/3. Per urgent care notes, patients symptoms were present for at least 5 days prior to visit  2. SEVERITY: "How bad is the cough today?"      Cough is a little better now, but it gets worse at night  3. SPUTUM: "Describe the color of your sputum" (none, dry cough; clear, white, yellow, green)     White  4. HEMOPTYSIS: "Are you coughing up any blood?" If so ask: "How much?" (flecks, streaks, tablespoons, etc.)     N/A  5. DIFFICULTY BREATHING: "Are you having difficulty breathing?" If Yes, ask: "How bad is it?" (e.g., mild, moderate, severe)    - MILD: No SOB at rest, mild SOB with walking, speaks normally in sentences, can lie down, no retractions, pulse < 100.    - MODERATE: SOB  at rest, SOB with minimal exertion and prefers to sit, cannot lie down flat, speaks in phrases, mild retractions, audible wheezing, pulse 100-120.    - SEVERE: Very SOB at rest, speaks in single words, struggling to breathe, sitting hunched forward, retractions, pulse > 120      No  6. FEVER: "Do you have a fever?" If Yes, ask: "What is your temperature, how was it measured, and when did it start?"     No fever  7. OTHER SYMPTOMS: "Do you have any other symptoms?" (e.g., runny nose, wheezing, chest pain) Nasal discharge, congestion  Protocols used: Cough - Acute Productive-A-AH

## 2023-12-06 ENCOUNTER — Telehealth: Payer: Self-pay | Admitting: Family Medicine

## 2023-12-06 NOTE — Telephone Encounter (Signed)
 Reviewed urgent care visit and x-ray, no signs of pneumonia which is reassuring.  Recommend to finish course of azithromycin, continue to use benzonatate for cough ensuring that 1 dose is at bedtime.  She can take Mucinex to help break up the remaining mucus causing nasal discharge and congestion making sure to drink at least 64 ounces of water to help the medicine to thin the mucus.  Patient will need an appointment for reevaluation for any additional prescriptions to be sent in.

## 2023-12-06 NOTE — Telephone Encounter (Signed)
 Pt informed of below.

## 2023-12-06 NOTE — Telephone Encounter (Signed)
 Kerri Taylor, RN15 hours ago (4:09 PM)      Chief Complaint: Cough Symptoms: Green nasal mucus, chest congestion, cough Pertinent Negatives: Patient denies fever, SOB Disposition: [] ED /[] Urgent Care (no appt availability in office) / [x] Appointment(In office/virtual)/ []  [facility]/ [] Home Care/ [] Refused Recommended Disposition /[] [place] [facility]/ []  Follow-up with PCP Additional Notes: Patient has been having cough, nasal discharge, and chest congestion. She was seen at urgent care on 3/3 and was given prescriptions for Azithromycin and Benzonatate. She started taking them on 3/3. She stated her cough seems a little better but it gets worse at night. Congestion is a little better. Patient stated there is no improvement at all with her nasal discharge, and it is now green. Patient wants another prescription to be sent. There are no appts at PCP office. Offered to schedule appointment tomorrow at different office and patient declined. She stated she will not return to urgent care either. Patient is aware that a message will be sent to the office.     Reason for Disposition  [1] Nasal discharge AND [2] present > 10 days  Answer Assessment - Initial Assessment Questions 1. ONSET: "When did the cough begin?"      Was seen at Urgent Care on 3/3. Per urgent care notes, patients symptoms were present for at least 5 days prior to visit  2. SEVERITY: "How bad is the cough today?"      Cough is a little better now, but it gets worse at night  3. SPUTUM: "Describe the color of your sputum" (none, dry cough; clear, white, yellow, green)     White  4. HEMOPTYSIS: "Are you coughing up any blood?" If so ask: "How much?" (flecks, streaks, tablespoons, etc.)     N/A  5. DIFFICULTY BREATHING: "Are you having difficulty breathing?" If Yes, ask: "How bad is it?" (e.g., mild, moderate, severe)    - MILD: No SOB at rest, mild SOB with walking, speaks normally in sentences, can lie  down, no retractions, pulse < 100.    - MODERATE: SOB at rest, SOB with minimal exertion and prefers to sit, cannot lie down flat, speaks in phrases, mild retractions, audible wheezing, pulse 100-120.    - SEVERE: Very SOB at rest, speaks in single words, struggling to breathe, sitting hunched forward, retractions, pulse > 120      No  6. FEVER: "Do you have a fever?" If Yes, ask: "What is your temperature, how was it measured, and when did it start?"     No fever  7. OTHER SYMPTOMS: "Do you have any other symptoms?" (e.g., runny nose, wheezing, chest pain) Nasal discharge, congestion  Protocols used: Cough - Acute Productive-A-AH       Note

## 2023-12-26 ENCOUNTER — Telehealth: Payer: Self-pay | Admitting: *Deleted

## 2023-12-26 NOTE — Telephone Encounter (Signed)
 LVM for pt to call office to see about scheduling her an appointment to follow up her labs.  It need to be scheduled within a month of labs and if not able to do this we can reschedule her to later with Dr. Constance Goltz or we can call her when the new provider gets set up and schedule her then.

## 2023-12-31 ENCOUNTER — Other Ambulatory Visit: Payer: Medicare Other

## 2024-01-07 ENCOUNTER — Encounter: Payer: Medicare Other | Admitting: Family Medicine

## 2024-03-18 ENCOUNTER — Other Ambulatory Visit: Payer: Self-pay | Admitting: *Deleted

## 2024-03-18 DIAGNOSIS — R7989 Other specified abnormal findings of blood chemistry: Secondary | ICD-10-CM

## 2024-03-18 DIAGNOSIS — E785 Hyperlipidemia, unspecified: Secondary | ICD-10-CM

## 2024-03-18 DIAGNOSIS — E559 Vitamin D deficiency, unspecified: Secondary | ICD-10-CM

## 2024-03-18 DIAGNOSIS — R7309 Other abnormal glucose: Secondary | ICD-10-CM

## 2024-03-21 ENCOUNTER — Other Ambulatory Visit: Payer: Self-pay | Admitting: Family Medicine

## 2024-03-21 DIAGNOSIS — E7800 Pure hypercholesterolemia, unspecified: Secondary | ICD-10-CM

## 2024-03-23 ENCOUNTER — Other Ambulatory Visit

## 2024-03-23 DIAGNOSIS — E785 Hyperlipidemia, unspecified: Secondary | ICD-10-CM | POA: Diagnosis not present

## 2024-03-23 DIAGNOSIS — E559 Vitamin D deficiency, unspecified: Secondary | ICD-10-CM | POA: Diagnosis not present

## 2024-03-23 DIAGNOSIS — R7989 Other specified abnormal findings of blood chemistry: Secondary | ICD-10-CM

## 2024-03-23 DIAGNOSIS — R7309 Other abnormal glucose: Secondary | ICD-10-CM

## 2024-03-24 ENCOUNTER — Ambulatory Visit: Payer: Self-pay

## 2024-03-24 LAB — LIPID PANEL
Chol/HDL Ratio: 2.9 ratio (ref 0.0–4.4)
Cholesterol, Total: 247 mg/dL — ABNORMAL HIGH (ref 100–199)
HDL: 85 mg/dL (ref >39)
LDL Chol Calc (NIH): 146 mg/dL — ABNORMAL HIGH (ref 0–99)
Triglycerides: 95 mg/dL (ref 0–149)
VLDL Cholesterol Cal: 16 mg/dL (ref 5–40)

## 2024-03-24 LAB — CBC WITH DIFFERENTIAL/PLATELET
Basophils Absolute: 0 10*3/uL (ref 0.0–0.2)
Basos: 1 %
EOS (ABSOLUTE): 0.2 10*3/uL (ref 0.0–0.4)
Eos: 4 %
Hematocrit: 41 % (ref 34.0–46.6)
Hemoglobin: 13.4 g/dL (ref 11.1–15.9)
Immature Grans (Abs): 0 10*3/uL (ref 0.0–0.1)
Immature Granulocytes: 0 %
Lymphocytes Absolute: 1.6 10*3/uL (ref 0.7–3.1)
Lymphs: 38 %
MCH: 31.5 pg (ref 26.6–33.0)
MCHC: 32.7 g/dL (ref 31.5–35.7)
MCV: 96 fL (ref 79–97)
Monocytes Absolute: 0.4 10*3/uL (ref 0.1–0.9)
Monocytes: 10 %
Neutrophils Absolute: 2.1 10*3/uL (ref 1.4–7.0)
Neutrophils: 47 %
Platelets: 294 10*3/uL (ref 150–450)
RBC: 4.26 x10E6/uL (ref 3.77–5.28)
RDW: 13 % (ref 11.7–15.4)
WBC: 4.3 10*3/uL (ref 3.4–10.8)

## 2024-03-24 LAB — TSH: TSH: 4.98 u[IU]/mL — ABNORMAL HIGH (ref 0.450–4.500)

## 2024-03-24 LAB — COMPREHENSIVE METABOLIC PANEL WITH GFR
ALT: 26 IU/L (ref 0–32)
AST: 28 IU/L (ref 0–40)
Albumin: 4.3 g/dL (ref 3.8–4.8)
Alkaline Phosphatase: 83 IU/L (ref 44–121)
BUN/Creatinine Ratio: 20 (ref 12–28)
BUN: 18 mg/dL (ref 8–27)
Bilirubin Total: 0.4 mg/dL (ref 0.0–1.2)
CO2: 24 mmol/L (ref 20–29)
Calcium: 9 mg/dL (ref 8.7–10.3)
Chloride: 103 mmol/L (ref 96–106)
Creatinine, Ser: 0.92 mg/dL (ref 0.57–1.00)
Globulin, Total: 2.3 g/dL (ref 1.5–4.5)
Glucose: 93 mg/dL (ref 70–99)
Potassium: 4.8 mmol/L (ref 3.5–5.2)
Sodium: 140 mmol/L (ref 134–144)
Total Protein: 6.6 g/dL (ref 6.0–8.5)
eGFR: 65 mL/min/{1.73_m2} (ref >59)

## 2024-03-24 LAB — HEMOGLOBIN A1C
Est. average glucose Bld gHb Est-mCnc: 111 mg/dL
Hgb A1c MFr Bld: 5.5 % (ref 4.8–5.6)

## 2024-03-24 LAB — VITAMIN D 25 HYDROXY (VIT D DEFICIENCY, FRACTURES): Vit D, 25-Hydroxy: 46.5 ng/mL (ref 30.0–100.0)

## 2024-03-30 ENCOUNTER — Ambulatory Visit (INDEPENDENT_AMBULATORY_CARE_PROVIDER_SITE_OTHER)

## 2024-03-30 VITALS — BP 132/78 | HR 68 | Ht 64.0 in | Wt 133.2 lb

## 2024-03-30 DIAGNOSIS — G8929 Other chronic pain: Secondary | ICD-10-CM | POA: Diagnosis not present

## 2024-03-30 DIAGNOSIS — F411 Generalized anxiety disorder: Secondary | ICD-10-CM

## 2024-03-30 DIAGNOSIS — E7800 Pure hypercholesterolemia, unspecified: Secondary | ICD-10-CM

## 2024-03-30 DIAGNOSIS — Z0000 Encounter for general adult medical examination without abnormal findings: Secondary | ICD-10-CM

## 2024-03-30 DIAGNOSIS — M25519 Pain in unspecified shoulder: Secondary | ICD-10-CM

## 2024-03-30 DIAGNOSIS — M791 Myalgia, unspecified site: Secondary | ICD-10-CM | POA: Insufficient documentation

## 2024-03-30 MED ORDER — ROSUVASTATIN CALCIUM 5 MG PO TABS: 5.0000 mg | ORAL_TABLET | Freq: Every day | ORAL | 0 refills | Status: DC

## 2024-03-30 MED ORDER — CYCLOBENZAPRINE HCL 10 MG PO TABS: 10.0000 mg | ORAL_TABLET | Freq: Every evening | ORAL | 0 refills | Status: AC | PRN

## 2024-03-30 NOTE — Assessment & Plan Note (Signed)
 Reviewed all labs/blood work with the patient.  Discussed her stable chronic conditions.  Discussed health screenings including colonoscopy, patient has a Cologuard kit at home that she has not done yet.  Advised patient to schedule a vaccine appointment at her preferred pharmacy to update her Tdap vaccine.  Advised patient to continue eating a well-balanced diet and exercising daily to support healthy lifestyle.  Will plan for yearly physicals with 76-month follow-ups on chronic conditions in between.  Patient verbalized understanding and was in agreement with this plan.

## 2024-03-30 NOTE — Assessment & Plan Note (Signed)
Stable.  Taking Celexa 40 mg daily and hydroxyzine 25 mg.  Continue medications as prescribed.

## 2024-03-30 NOTE — Patient Instructions (Signed)
 It was nice to see you today!  As we discussed in clinic:  -Continue all of your current medications as prescribed.  -Your lab work looked great and was reassuring.  -I have sent in refills for your rosuvastatin  and flexeril  -Please schedule at Vaccine appointment at [facility]Or other pharmacy to update your Tdap (tetanus) vaccine  -I recommend Debrox over the counter to soften ear wax and make it easier to remove when it bothers you   -I will plan on seeing you back in 6 months with labs the week before. It was nice to meet you!  If you have any problems before your next visit feel free to message me via MyChart (minor issues or questions) or call the office, otherwise you may reach out to schedule an office visit.  Thank you! Saddie Sacks, PA-C

## 2024-03-30 NOTE — Assessment & Plan Note (Signed)
 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 Have sent in a 90d refill to use as needed for muscle spasms with #0 refills. Will cont to monitor.

## 2024-03-30 NOTE — Assessment & Plan Note (Signed)
 Last lipid panel: LDL 146, HDL 85, triglycerides 95.  Lipids improved from the last time it was checked 8 months ago.  Continue rosuvastatin  5 mg daily for now.  CMP within normal limits.  Will recheck lipid panel in 6 months and discuss increasing rosuvastatin  to 10 mg if LDL remains the same or increases further.  Encouraged patient to continue daily exercise and monitoring intake of saturated/trans fats.

## 2024-03-30 NOTE — Progress Notes (Signed)
 Complete physical exam  Patient: Kerri Taylor   DOB: 10-10-1950   73 y.o. Female  MRN: [id number]  Subjective:    Chief Complaint  Patient presents with   Annual Exam    Kerri Taylor is a 73 y.o. female who presents today for a complete physical exam. She reports consuming a general diet. She gets regular exercise through walking, working outside, and cleaning houses 3x per week. She generally feels well. She reports sleeping well. Reports regular bowel movements. She does not have additional problems to discuss today.    Most recent fall risk assessment:    03/30/2024    2:00 PM  Fall Risk   Falls in the past year? 0  Number falls in past yr: 0  Injury with Fall? 0  Risk for fall due to : No Fall Risks  Follow up Falls evaluation completed     Most recent depression screenings:    03/30/2024    2:00 PM 02/20/2023    2:20 PM  PHQ 2/9 Scores  PHQ - 2 Score 0 0  PHQ- 9 Score 0 0        Patient Care Team: Gayle Saddie JULIANNA DEVONNA as PCP - General (Physician Assistant) Court Pulling, MD as Referring Physician (Dermatology) Avram Lupita BRAVO, MD as Consulting Physician (Gastroenterology)   Outpatient Medications Prior to Visit  Medication Sig   betamethasone  dipropionate 0.05 % cream Apply topically 2 (two) times daily.   cholecalciferol (VITAMIN D3) 25 MCG (1000 UT) tablet Take 1,000 Units by mouth daily.   citalopram  (CELEXA ) 40 MG tablet TAKE 1 TABLET BY MOUTH EVERY DAY   clobetasol cream (TEMOVATE) 0.05 % Apply 1 Application topically 2 (two) times daily.   levothyroxine  (SYNTHROID ) 25 MCG tablet Take 1 tablet (25 mcg total) by mouth daily.   triamcinolone  (KENALOG ) 0.025 % ointment Apply topically 2 (two) times daily.   Vitamin D , Ergocalciferol , (DRISDOL ) 1.25 MG (50000 UNIT) CAPS capsule Take 1 capsule (50,000 Units total) by mouth every 7 (seven) days.   [DISCONTINUED] azithromycin  (ZITHROMAX ) 250 MG tablet Take 2 tabs PO x 1 dose, then 1 tab PO QD x 4 days    [DISCONTINUED] benzonatate  (TESSALON ) 200 MG capsule Take 1 capsule (200 mg total) by mouth 3 (three) times daily as needed for cough.   [DISCONTINUED] cyclobenzaprine  (FLEXERIL ) 10 MG tablet Take 1 tablet (10 mg total) by mouth at bedtime as needed for muscle spasms.   [DISCONTINUED] rosuvastatin  (CRESTOR ) 5 MG tablet TAKE 1 TABLET (5 MG TOTAL) BY MOUTH DAILY.   No facility-administered medications prior to visit.    ROS   Per HPI.     Objective:     BP 132/78   Pulse 68   Ht 5' 4 (1.626 m)   Wt 133 lb 4 oz (60.4 kg)   SpO2 97%   BMI 22.87 kg/m    Physical Exam Constitutional:      General: She is not in acute distress.    Appearance: Normal appearance.  HENT:     Right Ear: Tympanic membrane normal.     Left Ear: Tympanic membrane normal.     Mouth/Throat:     Mouth: Mucous membranes are moist.     Pharynx: Oropharynx is clear.   Eyes:     Pupils: Pupils are equal, round, and reactive to light.    Cardiovascular:     Rate and Rhythm: Normal rate and regular rhythm.     Heart sounds: Normal heart sounds. No  murmur heard.    No friction rub. No gallop.  Pulmonary:     Effort: Pulmonary effort is normal. No respiratory distress.     Breath sounds: Normal breath sounds.  Abdominal:     General: Abdomen is flat.     Palpations: Abdomen is soft.     Comments: Bowel sounds normal   Musculoskeletal:        General: No swelling.     Cervical back: Neck supple.  Lymphadenopathy:     Cervical: No cervical adenopathy.   Skin:    General: Skin is warm and dry.   Neurological:     General: No focal deficit present.     Mental Status: She is alert.   Psychiatric:        Mood and Affect: Mood normal.        Behavior: Behavior normal.        Thought Content: Thought content normal.      No results found for any visits on 03/30/24. Last CBC Lab Results  Component Value Date   WBC 4.3 03/23/2024   HGB 13.4 03/23/2024   HCT 41.0 03/23/2024   MCV 96  03/23/2024   MCH 31.5 03/23/2024   RDW 13.0 03/23/2024   PLT 294 03/23/2024   Last metabolic panel Lab Results  Component Value Date   GLUCOSE 93 03/23/2024   NA 140 03/23/2024   K 4.8 03/23/2024   CL 103 03/23/2024   CO2 24 03/23/2024   BUN 18 03/23/2024   CREATININE 0.92 03/23/2024   EGFR 65 03/23/2024   CALCIUM  9.0 03/23/2024   PROT 6.6 03/23/2024   ALBUMIN 4.3 03/23/2024   LABGLOB 2.3 03/23/2024   AGRATIO 1.8 02/05/2023   BILITOT 0.4 03/23/2024   ALKPHOS 83 03/23/2024   AST 28 03/23/2024   ALT 26 03/23/2024   Last lipids Lab Results  Component Value Date   CHOL 247 (H) 03/23/2024   HDL 85 03/23/2024   LDLCALC 146 (H) 03/23/2024   LDLDIRECT 199.7 10/19/2013   TRIG 95 03/23/2024   CHOLHDL 2.9 03/23/2024   Last hemoglobin A1c Lab Results  Component Value Date   HGBA1C 5.5 03/23/2024   Last thyroid  functions Lab Results  Component Value Date   TSH 4.980 (H) 03/23/2024        Assessment & Plan:    Routine Health Maintenance and Physical Exam  Health Maintenance  Topic Date Due   Zoster (Shingles) Vaccine (1 of 2) Never done   DTaP/Tdap/Td vaccine (3 - Td or Tdap) 10/20/2022   COVID-19 Vaccine (4 - 2024-25 season) 06/02/2023   Medicare Annual Wellness Visit  11/14/2023   Colon Cancer Screening  05/04/2024   Flu Shot  05/01/2024   Pneumococcal Vaccine for age over 41  Completed   DEXA scan (bone density measurement)  Completed   Hepatitis C Screening  Completed   Hepatitis B Vaccine  Aged Out   HPV Vaccine  Aged Out   Meningitis B Vaccine  Aged Out    Discussed health benefits of physical activity, and encouraged her to engage in regular exercise appropriate for her age and condition.  Generalized anxiety disorder Assessment & Plan: Stable.  Taking Celexa  40 mg daily and hydroxyzine  25 mg.  Continue medications as prescribed.   Chronic shoulder pain, unspecified laterality -     Cyclobenzaprine  HCl; Take 1 tablet (10 mg total) by mouth at  bedtime as needed for muscle spasms.  Dispense: 30 tablet; Refill: 0  Pure hypercholesterolemia Assessment &  Plan: Last lipid panel: LDL 146, HDL 85, triglycerides 95.  Lipids improved from the last time it was checked 8 months ago.  Continue rosuvastatin  5 mg daily for now.  CMP within normal limits.  Will recheck lipid panel in 6 months and discuss increasing rosuvastatin  to 10 mg if LDL remains the same or increases further.  Encouraged patient to continue daily exercise and monitoring intake of saturated/trans fats.  Orders: -     Rosuvastatin  Calcium ; Take 1 tablet (5 mg total) by mouth daily.  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 Have sent in a 90d refill to use as needed for muscle spasms with #0 refills. Will cont to monitor.   General medical exam Assessment & Plan: Reviewed all labs/blood work with the patient.  Discussed her stable chronic conditions.  Discussed health screenings including colonoscopy, patient has a Cologuard kit at home that she has not done yet.  Advised patient to schedule a vaccine appointment at her preferred pharmacy to update her Tdap vaccine.  Advised patient to continue eating a well-balanced diet and exercising daily to support healthy lifestyle.  Will plan for yearly physicals with 50-month follow-ups on chronic conditions in between.  Patient verbalized understanding and was in agreement with this plan.     Return in about 6 months (around 09/29/2024) for HLD, Mood.     Saddie JULIANNA Sacks, PA-C

## 2024-05-02 ENCOUNTER — Other Ambulatory Visit: Payer: Self-pay | Admitting: Family Medicine

## 2024-05-02 DIAGNOSIS — F411 Generalized anxiety disorder: Secondary | ICD-10-CM

## 2024-05-12 DIAGNOSIS — Z85820 Personal history of malignant melanoma of skin: Secondary | ICD-10-CM | POA: Diagnosis not present

## 2024-05-12 DIAGNOSIS — L820 Inflamed seborrheic keratosis: Secondary | ICD-10-CM | POA: Diagnosis not present

## 2024-05-12 DIAGNOSIS — L821 Other seborrheic keratosis: Secondary | ICD-10-CM | POA: Diagnosis not present

## 2024-05-12 DIAGNOSIS — L570 Actinic keratosis: Secondary | ICD-10-CM | POA: Diagnosis not present

## 2024-05-12 DIAGNOSIS — D2361 Other benign neoplasm of skin of right upper limb, including shoulder: Secondary | ICD-10-CM | POA: Diagnosis not present

## 2024-05-12 DIAGNOSIS — L814 Other melanin hyperpigmentation: Secondary | ICD-10-CM | POA: Diagnosis not present

## 2024-05-26 DIAGNOSIS — K080 Exfoliation of teeth due to systemic causes: Secondary | ICD-10-CM | POA: Diagnosis not present

## 2024-07-21 ENCOUNTER — Telehealth: Payer: Self-pay

## 2024-07-21 DIAGNOSIS — E038 Other specified hypothyroidism: Secondary | ICD-10-CM

## 2024-07-21 MED ORDER — LEVOTHYROXINE SODIUM 25 MCG PO TABS: 25.0000 ug | ORAL_TABLET | Freq: Every day | ORAL | 3 refills | Status: DC

## 2024-07-21 NOTE — Telephone Encounter (Signed)
 Pt is requesting refill for Levothyroxine  (SYNTHROID ) 25 MCG tablet.  Please send to [facility]

## 2024-07-21 NOTE — Telephone Encounter (Signed)
 Refill sent.

## 2024-07-23 ENCOUNTER — Telehealth: Payer: Self-pay

## 2024-07-23 ENCOUNTER — Other Ambulatory Visit: Payer: Self-pay

## 2024-07-23 DIAGNOSIS — E038 Other specified hypothyroidism: Secondary | ICD-10-CM

## 2024-07-23 MED ORDER — LEVOTHYROXINE SODIUM 25 MCG PO TABS
25.0000 ug | ORAL_TABLET | Freq: Every day | ORAL | 3 refills | Status: AC
Start: 2024-07-23 — End: ?

## 2024-07-23 NOTE — Telephone Encounter (Signed)
 Copied from CRM #[id number]. Topic: Clinical - Prescription Issue >> Jul 23, 2024  1:03 PM Leonette SQUIBB wrote: Reason for CRM: patient called saying her synthroid  was sent [facility] and she has to use [facility].  She said insurance does not use [facility]  [phone]

## 2024-07-23 NOTE — Telephone Encounter (Signed)
 Rx re-sent to [facility]! Thank you

## 2024-09-28 ENCOUNTER — Other Ambulatory Visit: Payer: Self-pay

## 2024-09-28 DIAGNOSIS — R7309 Other abnormal glucose: Secondary | ICD-10-CM

## 2024-09-28 DIAGNOSIS — E559 Vitamin D deficiency, unspecified: Secondary | ICD-10-CM

## 2024-09-28 DIAGNOSIS — E7800 Pure hypercholesterolemia, unspecified: Secondary | ICD-10-CM

## 2024-09-28 DIAGNOSIS — R7989 Other specified abnormal findings of blood chemistry: Secondary | ICD-10-CM

## 2024-09-29 ENCOUNTER — Other Ambulatory Visit

## 2024-09-29 DIAGNOSIS — E559 Vitamin D deficiency, unspecified: Secondary | ICD-10-CM

## 2024-09-29 DIAGNOSIS — R7309 Other abnormal glucose: Secondary | ICD-10-CM

## 2024-09-29 DIAGNOSIS — E7800 Pure hypercholesterolemia, unspecified: Secondary | ICD-10-CM

## 2024-09-29 DIAGNOSIS — R7989 Other specified abnormal findings of blood chemistry: Secondary | ICD-10-CM

## 2024-10-06 ENCOUNTER — Ambulatory Visit (INDEPENDENT_AMBULATORY_CARE_PROVIDER_SITE_OTHER)

## 2024-10-06 VITALS — BP 130/81 | HR 80 | Temp 98.3°F | Ht 64.0 in | Wt 135.0 lb

## 2024-10-06 DIAGNOSIS — F411 Generalized anxiety disorder: Secondary | ICD-10-CM

## 2024-10-06 DIAGNOSIS — H539 Unspecified visual disturbance: Secondary | ICD-10-CM | POA: Insufficient documentation

## 2024-10-06 DIAGNOSIS — Z1211 Encounter for screening for malignant neoplasm of colon: Secondary | ICD-10-CM

## 2024-10-06 DIAGNOSIS — Z23 Encounter for immunization: Secondary | ICD-10-CM | POA: Diagnosis not present

## 2024-10-06 DIAGNOSIS — E7800 Pure hypercholesterolemia, unspecified: Secondary | ICD-10-CM

## 2024-10-06 MED ORDER — CITALOPRAM HYDROBROMIDE 40 MG PO TABS: 40.0000 mg | ORAL_TABLET | Freq: Every day | ORAL | 1 refills | Status: AC

## 2024-10-06 MED ORDER — ROSUVASTATIN CALCIUM 5 MG PO TABS: 5.0000 mg | ORAL_TABLET | Freq: Every day | ORAL | 2 refills | Status: AC

## 2024-10-06 NOTE — Patient Instructions (Signed)
 VISIT SUMMARY: Today, you had a routine wellness visit where we discussed your preventive health measures, travel plans, and medication refills. You received a tetanus vaccination and we ordered a Cologuard test for colon cancer screening.  YOUR PLAN: MEDICATION MANAGEMENT: You needed a refill for Celexa  and have enough rosuvastatin  and thyroid  medication until your next visit. -Refilled your citalopram  (Celexa ) prescription. -Continue taking your rosuvastatin  and thyroid  medication as prescribed.  DIPLOPIA (DOUBLE VISION): You experience occasional episodes of double vision, which are brief and not associated with headaches. -You are due for an eye exam this year. Please schedule an appointment with your ophthalmologist.  PREVENTIVE HEALTH MAINTENANCE: We discussed your preventive health measures including vaccinations and cancer screenings. -You received a tetanus vaccination today. -We ordered a Cologuard test for your colon cancer screening. -You did not receive the flu vaccine this year. Consider discussing this further if you have concerns.  TRAVEL HEALTH CONCERNS: You are planning a cruise and are concerned about potential illnesses during travel. -Continue taking vitamin C to support your immune system. -Stay updated on any travel advisories for the areas you will be visiting.  PURE HYPERCHOLESTEROLEMIA: Your cholesterol levels are being managed with medication. -Refilled your rosuvastatin  prescription. Continue taking it as prescribed.  GENERALIZED ANXIETY DISORDER: Your anxiety symptoms have not changed. -Refilled your citalopram  (Celexa ) prescription. Continue taking it as prescribed.  If you have any problems before your next visit feel free to message me via MyChart (minor issues or questions) or call the office, otherwise you may reach out to schedule an office visit.  Thank you! Saddie Sacks, PA-C

## 2024-10-06 NOTE — Assessment & Plan Note (Signed)
 Updating lipid panel with labs. Per dispense report, patient has not picked up rosuvastatin  in several months. Reports she is taking it daily. If LDL remains elevated may question medication adherence  versus needing a higher therapeutic dose.

## 2024-10-06 NOTE — Progress Notes (Signed)
 "  Established Patient Office Visit  Subjective   Patient ID: Kerri Taylor, female    DOB: 1951/09/30  Age: 74 y.o. MRN: [id number]  Chief Complaint  Patient presents with   Medication Management    HPI   History of Present Illness   Kerri Taylor is a 74 year old female who presents for a six-month check-in and medication refills.  Medication management - Out of Celexa  and requires a refill. - Continues rosuvastatin  and thyroid  medication with sufficient supply until next visit in January 2026.  Diplopia - Experiences occasional episodes of diplopia characterized by a sensation of eyes crossing and seeing double when trying to focus on certain objects - Episodes are brief and not associated with cephalalgia. - Wears corrective lenses and has not had an updated eye exam  - Difficulty with small print and is due for ophthalmologic examination this year. - No loss of vision/blacking out, no flashes of light, no dizziness or hearing loss. No unilateral weakness or facial droop.   Preventive health maintenance - Received pneumococcal vaccine in 2018. - Did not receive flu vaccine this year due to feeling unwell after previous vaccination. - Due for colonoscopy and interested in Cologuard test. - Previous colonoscopies have had normal results.  Travel health concerns - Planning a cruise to several [place] including the [place] and [place] this month.  - Concerned about potential illnesses during travel. - Taking vitamin C to support immune system.  - Plans to wear a mask on the ship and keep hand sanitizer with her          ROS Per HPI.    Objective:     BP 130/81   Pulse 80   Temp 98.3 F (36.8 C) (Oral)   Ht 5' 4 (1.626 m)   Wt 135 lb 0.6 oz (61.3 kg)   SpO2 95%   BMI 23.18 kg/m    Physical Exam Constitutional:      General: She is not in acute distress.    Appearance: Normal appearance.  Eyes:     Pupils: Pupils are equal, round, and  reactive to light.  Cardiovascular:     Rate and Rhythm: Normal rate and regular rhythm.     Heart sounds: Normal heart sounds. No murmur heard.    No friction rub. No gallop.  Pulmonary:     Effort: Pulmonary effort is normal. No respiratory distress.     Breath sounds: Normal breath sounds.  Musculoskeletal:        General: No swelling.  Skin:    General: Skin is warm and dry.  Neurological:     General: No focal deficit present.     Mental Status: She is alert.  Psychiatric:        Mood and Affect: Mood normal.        Behavior: Behavior normal.        Thought Content: Thought content normal.      No results found for any visits on 10/06/24.    The 10-year ASCVD risk score (Arnett DK, et al., 2019) is: 16.9%    Assessment & Plan:   Encounter for vaccination -     Tdap vaccine greater than or equal to 7yo IM  ANXIETY DISORDER, GENERALIZED Assessment & Plan: Stable. Taking Celexa  40 mg daily and hydroxyzine  25 mg. Continue medications as prescribed.   Orders: -     Citalopram  Hydrobromide; Take 1 tablet (40 mg total) by mouth daily.  Dispense: 90  tablet; Refill: 1  Pure hypercholesterolemia Assessment & Plan: Updating lipid panel with labs. Per dispense report, patient has not picked up rosuvastatin  in several months. Reports she is taking it daily. If LDL remains elevated may question medication adherence  versus needing a higher therapeutic dose.   Orders: -     Rosuvastatin  Calcium ; Take 1 tablet (5 mg total) by mouth daily.  Dispense: 90 tablet; Refill: 2  Screen for colon cancer -     Cologuard  Visual disturbance Assessment & Plan: Likely due to worsening vision without correction. Encouraged eye exam and encouraged to monitor symptoms. Notify if symptoms worsen. No red flag symptoms today. Will cont to monitor.     Return in about 6 months (around 04/05/2025) for Med check.    Saddie JULIANNA Sacks, PA-C  "

## 2024-10-06 NOTE — Assessment & Plan Note (Signed)
Stable.  Taking Celexa 40 mg daily and hydroxyzine 25 mg.  Continue medications as prescribed.

## 2024-10-06 NOTE — Assessment & Plan Note (Signed)
 Likely due to worsening vision without correction. Encouraged eye exam and encouraged to monitor symptoms. Notify if symptoms worsen. No red flag symptoms today. Will cont to monitor.

## 2024-11-09 ENCOUNTER — Other Ambulatory Visit

## 2024-11-11 ENCOUNTER — Other Ambulatory Visit

## 2024-11-18 ENCOUNTER — Other Ambulatory Visit

## 2025-04-06 ENCOUNTER — Ambulatory Visit
